# Patient Record
Sex: Male | Born: 1935 | Race: White | Hispanic: No | Smoking: Former smoker
Health system: Southern US, Community
[De-identification: ages and names within clinical notes are randomized; demographics above are authoritative.]

## PROBLEM LIST (undated history)

## (undated) DIAGNOSIS — I350 Nonrheumatic aortic (valve) stenosis: Secondary | ICD-10-CM

## (undated) DIAGNOSIS — R251 Tremor, unspecified: Secondary | ICD-10-CM

## (undated) DIAGNOSIS — N400 Enlarged prostate without lower urinary tract symptoms: Secondary | ICD-10-CM

## (undated) DIAGNOSIS — I10 Essential (primary) hypertension: Secondary | ICD-10-CM

## (undated) DIAGNOSIS — I509 Heart failure, unspecified: Secondary | ICD-10-CM

## (undated) DIAGNOSIS — H9190 Unspecified hearing loss, unspecified ear: Secondary | ICD-10-CM

## (undated) DIAGNOSIS — K219 Gastro-esophageal reflux disease without esophagitis: Secondary | ICD-10-CM

## (undated) DIAGNOSIS — I779 Disorder of arteries and arterioles, unspecified: Secondary | ICD-10-CM

## (undated) DIAGNOSIS — R011 Cardiac murmur, unspecified: Secondary | ICD-10-CM

## (undated) DIAGNOSIS — Z951 Presence of aortocoronary bypass graft: Secondary | ICD-10-CM

## (undated) DIAGNOSIS — E785 Hyperlipidemia, unspecified: Secondary | ICD-10-CM

## (undated) DIAGNOSIS — I739 Peripheral vascular disease, unspecified: Secondary | ICD-10-CM

## (undated) DIAGNOSIS — C801 Malignant (primary) neoplasm, unspecified: Secondary | ICD-10-CM

## (undated) DIAGNOSIS — I6523 Occlusion and stenosis of bilateral carotid arteries: Secondary | ICD-10-CM

## (undated) DIAGNOSIS — E0590 Thyrotoxicosis, unspecified without thyrotoxic crisis or storm: Secondary | ICD-10-CM

## (undated) DIAGNOSIS — Z8619 Personal history of other infectious and parasitic diseases: Secondary | ICD-10-CM

## (undated) DIAGNOSIS — I2510 Atherosclerotic heart disease of native coronary artery without angina pectoris: Secondary | ICD-10-CM

## (undated) HISTORY — DX: Cardiac murmur, unspecified: R01.1

## (undated) HISTORY — DX: Peripheral vascular disease, unspecified: I73.9

## (undated) HISTORY — DX: Atherosclerotic heart disease of native coronary artery without angina pectoris: I25.10

## (undated) HISTORY — PX: TONSILLECTOMY: SUR1361

## (undated) HISTORY — DX: Presence of aortocoronary bypass graft: Z95.1

## (undated) HISTORY — PX: COLONOSCOPY: SHX174

## (undated) HISTORY — DX: Nonrheumatic aortic (valve) stenosis: I35.0

## (undated) HISTORY — DX: Essential (primary) hypertension: I10

## (undated) HISTORY — DX: Occlusion and stenosis of bilateral carotid arteries: I65.23

## (undated) HISTORY — PX: EYE SURGERY: SHX253

## (undated) HISTORY — DX: Disorder of arteries and arterioles, unspecified: I77.9

## (undated) HISTORY — DX: Hyperlipidemia, unspecified: E78.5

---

## 1994-02-25 HISTORY — PX: CARDIAC CATHETERIZATION: SHX172

## 1994-02-27 DIAGNOSIS — Z951 Presence of aortocoronary bypass graft: Secondary | ICD-10-CM

## 1994-02-27 HISTORY — PX: CORONARY ARTERY BYPASS GRAFT: SHX141

## 1994-02-27 HISTORY — DX: Presence of aortocoronary bypass graft: Z95.1

## 1998-05-04 HISTORY — PX: INTRAOCULAR LENS INSERTION: SHX110

## 2001-05-31 ENCOUNTER — Encounter: Payer: Self-pay | Admitting: Endocrinology

## 2001-05-31 ENCOUNTER — Ambulatory Visit (HOSPITAL_COMMUNITY): Admission: RE | Admit: 2001-05-31 | Discharge: 2001-05-31 | Payer: Self-pay | Admitting: Endocrinology

## 2001-06-14 ENCOUNTER — Encounter: Admission: RE | Admit: 2001-06-14 | Discharge: 2001-06-14 | Payer: Self-pay | Admitting: Orthopedic Surgery

## 2001-06-14 ENCOUNTER — Encounter: Payer: Self-pay | Admitting: Orthopedic Surgery

## 2001-06-15 ENCOUNTER — Ambulatory Visit (HOSPITAL_BASED_OUTPATIENT_CLINIC_OR_DEPARTMENT_OTHER): Admission: RE | Admit: 2001-06-15 | Discharge: 2001-06-15 | Payer: Self-pay | Admitting: Orthopedic Surgery

## 2002-09-18 ENCOUNTER — Ambulatory Visit (HOSPITAL_COMMUNITY): Admission: RE | Admit: 2002-09-18 | Discharge: 2002-09-18 | Payer: Self-pay | Admitting: Urology

## 2002-09-18 ENCOUNTER — Encounter: Payer: Self-pay | Admitting: Urology

## 2002-09-21 ENCOUNTER — Encounter: Payer: Self-pay | Admitting: Urology

## 2002-09-21 ENCOUNTER — Ambulatory Visit (HOSPITAL_BASED_OUTPATIENT_CLINIC_OR_DEPARTMENT_OTHER): Admission: RE | Admit: 2002-09-21 | Discharge: 2002-09-21 | Payer: Self-pay | Admitting: Urology

## 2003-05-05 DIAGNOSIS — Z8619 Personal history of other infectious and parasitic diseases: Secondary | ICD-10-CM

## 2003-05-05 HISTORY — DX: Personal history of other infectious and parasitic diseases: Z86.19

## 2006-07-02 ENCOUNTER — Encounter: Admission: RE | Admit: 2006-07-02 | Discharge: 2006-07-02 | Payer: Self-pay | Admitting: Otolaryngology

## 2006-07-06 ENCOUNTER — Ambulatory Visit (HOSPITAL_BASED_OUTPATIENT_CLINIC_OR_DEPARTMENT_OTHER): Admission: RE | Admit: 2006-07-06 | Discharge: 2006-07-07 | Payer: Self-pay | Admitting: Otolaryngology

## 2009-04-08 ENCOUNTER — Emergency Department (HOSPITAL_COMMUNITY): Admission: EM | Admit: 2009-04-08 | Discharge: 2009-04-08 | Payer: Self-pay | Admitting: Emergency Medicine

## 2010-04-01 HISTORY — PX: NM MYOVIEW LTD: HXRAD82

## 2010-08-05 LAB — URINALYSIS, ROUTINE W REFLEX MICROSCOPIC
Bilirubin Urine: NEGATIVE
Glucose, UA: NEGATIVE mg/dL
Ketones, ur: NEGATIVE mg/dL
Nitrite: NEGATIVE
Protein, ur: NEGATIVE mg/dL
Specific Gravity, Urine: 1.017 (ref 1.005–1.030)
Urobilinogen, UA: 1 mg/dL (ref 0.0–1.0)
pH: 8 (ref 5.0–8.0)

## 2010-08-05 LAB — URINE MICROSCOPIC-ADD ON

## 2010-08-05 LAB — POCT I-STAT, CHEM 8
Chloride: 103 mEq/L (ref 96–112)
Creatinine, Ser: 1.3 mg/dL (ref 0.4–1.5)
Glucose, Bld: 124 mg/dL — ABNORMAL HIGH (ref 70–99)
Potassium: 3.7 mEq/L (ref 3.5–5.1)

## 2010-08-05 LAB — URINE CULTURE

## 2010-09-18 ENCOUNTER — Other Ambulatory Visit: Payer: Self-pay | Admitting: Dermatology

## 2010-09-19 NOTE — Op Note (Signed)
NAMEMAVERICK, Austin Ward         ACCOUNT NO.:  [id number]   MEDICAL RECORD NO.:  [id number]          PATIENT TYPE:  AMB   LOCATION:  DSC                          FACILITY:  [facility]   PHYSICIAN:  Christopher E. Ezzard Standing, M.D.DATE OF BIRTH:  [birth year]   DATE OF PROCEDURE:  07/06/2006  DATE OF DISCHARGE:                               OPERATIVE REPORT   PREOPERATIVE DIAGNOSIS:  Left lateral tongue leukoplakia, with biopsy  showing carcinoma in situ.   POSTOPERATIVE DIAGNOSIS:  Left lateral tongue leukoplakia, with biopsy  showing carcinoma in situ.   OPERATION:  CO2 laser excision of left lateral tongue leukoplakia, and  ablation of leukoplakia in left floor of mouth region.   SURGEON:  Kristine Garbe. Ezzard Standing, M.D.   ANESTHESIA:  General endotracheal.   COMPLICATIONS:  None.   BRIEF CLINICAL NOTE:  Austin Ward is an 77 year old gentleman,  who has had a little bit of soreness on the left side of his tongue for  a little over a month.  He had some irregular mucosal changes with a  little bit of leukoplakia.  A biopsy by Dr. Manson Passey demonstrated carcinoma  in situ.  He is taken to the operating room at this time for excision of  the left lateral tongue mucosal changes.  He also has a few mucosal  lesions on the left floor of mouth in the sulcus left side, as well as  some smaller changes over the anterior floor of mouth around the  submandibular duct openings.   DESCRIPTION OF PROCEDURE:  After adequate endotracheal anesthesia, the  mouth and tongue were examined.  There was irregularity of the mucosa on  the left lateral tongue.  The margins of this irregularity were marked  out with the CO2 laser, and then, using the CO2 laser at 10 W  continuous, the mucosa over this area was excised and sent to pathology  for permanent sections.  There are a few other small mucosal  abnormalities along more inferiorly on the left side, left floor of  mouth, and a little bit anteriorly over  the region of the submandibular  duct openings.  These areas were ablated with CO2 laser at a defocused  beam.  Hemostasis was obtained with a cold sponge compress, left lateral  tongue.  The patient was awakened from anesthesia and transferred to the  recovery room postoperatively doing well.   DISPOSITION:  Austin Ward will be observed overnight in the recovery care  center because of living alone at home.  We will plan discharge tomorrow  morning on amoxicillin antibiotic and Hycodan p.r.n. pain.  We will have  him follow up in my office in one week for recheck and review pathology.           ______________________________  Kristine Garbe Ezzard Standing, M.D.     CEN/MEDQ  D:  07/06/2006  T:  07/06/2006  Job:  [id number]   cc:   Grant Ruts., D.D.S.

## 2010-09-19 NOTE — Op Note (Signed)
[place]  Patient:    DAVIDE, RISDON Visit Number: [id number] MRN: [id number]          Service Type: DSU Location: [facility] Attending Physician:  Marlowe Shores Dictated by:   Artist Pais Mina Marble, M.D. Proc. Date: 06/15/01 Admit Date:  06/15/2001                             Operative Report  PREOPERATIVE DIAGNOSIS:  Left ring finger foreign body in volar aspect over the middle phalanx.  POSTOPERATIVE DIAGNOSIS:  Left ring finger foreign body in volar aspect over the middle phalanx.  PROCEDURE:  Excision of foreign body of the left ring finger.  SURGEON:  Artist Pais. Mina Marble, M.D.  ASSISTANT:  ______  ANESTHESIA:  Monitored anesthesia care with 2% plain lidocaine local block.  TOURNIQUET TIME:  10 minutes.  COMPLICATIONS:  None.  DRAINS:  None.  DESCRIPTION OF PROCEDURE:  The patient was taken to the operating room.  After the induction of adequate IV sedation, the left upper extremity was prepped and draped in the usual sterile fashion.  An Esmarch was used to exsanguinate the limb.  The tourniquet was inflated to 200 mmHg.  At this point in time, a small amount of 2% plain lidocaine was injected subcutaneously over the middle phalanx and a Brunner-type incision was made, incision taken down to the skin and subcutaneous tissues.  The ______ neurovascular bundle was carefully retracted and a piece of glass was removed from the deep tissues just overlying the flexor sheath.  The wound was then thoroughly irrigated and closed with 5-0 nylon and horizontal mattress sutures x2.  A sterile dressing with Xeroform and 4 x 4s and compressive dressing was applied.  The patient tolerated the procedure well and went to the recovery room in stable fashion. Dictated by:   Artist Pais Mina Marble, M.D. Attending Physician:  Marlowe Shores DD:  06/15/01 TD:  06/15/01 Job: [id number]

## 2011-07-15 ENCOUNTER — Other Ambulatory Visit: Payer: Self-pay | Admitting: Dermatology

## 2012-05-17 ENCOUNTER — Other Ambulatory Visit (HOSPITAL_COMMUNITY): Payer: Self-pay | Admitting: Cardiovascular Disease

## 2012-05-17 DIAGNOSIS — R011 Cardiac murmur, unspecified: Secondary | ICD-10-CM

## 2012-05-17 DIAGNOSIS — I6529 Occlusion and stenosis of unspecified carotid artery: Secondary | ICD-10-CM

## 2012-06-16 ENCOUNTER — Ambulatory Visit (HOSPITAL_COMMUNITY): Payer: Self-pay

## 2012-06-16 ENCOUNTER — Inpatient Hospital Stay (HOSPITAL_COMMUNITY): Admission: RE | Admit: 2012-06-16 | Payer: Self-pay | Source: Ambulatory Visit

## 2012-06-16 ENCOUNTER — Encounter (HOSPITAL_COMMUNITY): Payer: Self-pay

## 2012-07-04 ENCOUNTER — Ambulatory Visit (HOSPITAL_COMMUNITY)
Admission: RE | Admit: 2012-07-04 | Discharge: 2012-07-04 | Disposition: A | Discharge status: Home / Self Care | Payer: Medicare Other | Source: Ambulatory Visit | Attending: Cardiovascular Disease | Admitting: Cardiovascular Disease

## 2012-07-04 DIAGNOSIS — I6529 Occlusion and stenosis of unspecified carotid artery: Secondary | ICD-10-CM | POA: Insufficient documentation

## 2012-07-04 DIAGNOSIS — R011 Cardiac murmur, unspecified: Secondary | ICD-10-CM | POA: Insufficient documentation

## 2012-07-04 HISTORY — PX: US ECHOCARDIOGRAPHY: HXRAD669

## 2012-07-04 NOTE — Progress Notes (Signed)
Carotid Duplex Completed. Sturdivant, Rita D  

## 2012-07-04 NOTE — Progress Notes (Signed)
2D Echo Performed 07/04/2012    Halona Amstutz, RCS  

## 2012-08-29 ENCOUNTER — Encounter: Payer: Self-pay | Admitting: *Deleted

## 2012-09-01 ENCOUNTER — Encounter: Payer: Self-pay | Admitting: Cardiovascular Disease

## 2012-09-07 ENCOUNTER — Ambulatory Visit (INDEPENDENT_AMBULATORY_CARE_PROVIDER_SITE_OTHER): Payer: Medicare Other | Admitting: Podiatry

## 2012-09-07 ENCOUNTER — Encounter: Payer: Self-pay | Admitting: Podiatry

## 2012-09-07 VITALS — BP 135/57 | HR 64 | Ht 70.0 in | Wt 175.0 lb

## 2012-09-07 DIAGNOSIS — B351 Tinea unguium: Secondary | ICD-10-CM | POA: Insufficient documentation

## 2012-09-07 DIAGNOSIS — M25579 Pain in unspecified ankle and joints of unspecified foot: Secondary | ICD-10-CM

## 2012-09-07 NOTE — Progress Notes (Signed)
Subjective: 79 y.o. year old male patient presents complaining of painful nails. Patient requests toe nails trimmed. Left foot hurts on the ball. Patient had problem with his hearing aid and could not answer to questions on Review Of Systems. He will be getting a new hearing aid this week.  Review of Systems - Unable to obtain due to hearing problem.  Objective: Dermatologic: Thick yellow deformed nails x 10.   Contracted digits bilateral. No plantar skin lesions. Vascular: Pedal pulses are not palpable on both feet.   Orthopedic: Contracted lesser digits in high arched cavus foot bilateral. Thin plantar fat pad bilateral. Neurologic: All epicritic and tactile sensations grossly intact. Positive response to Monofilament sensory and vibratory testing.  Assessment: Dystrophic mycotic nails x 10. Lesser metatarsalgia due to thin plantar fat pad.  Treatment: All mycotic nails debrided.  Advised to use well padded Tennis shoes. Return in 3 months or as needed.

## 2012-09-19 ENCOUNTER — Other Ambulatory Visit: Payer: Self-pay | Admitting: Pharmacist

## 2012-09-19 MED ORDER — DILTIAZEM HCL ER BEADS 180 MG PO CP24: 180.0000 mg | ORAL_CAPSULE | Freq: Every day | ORAL | Status: DC

## 2012-09-22 ENCOUNTER — Other Ambulatory Visit: Payer: Self-pay | Admitting: *Deleted

## 2012-09-22 NOTE — Telephone Encounter (Signed)
Please refill this 

## 2012-10-10 ENCOUNTER — Telehealth: Payer: Self-pay | Admitting: Cardiovascular Disease

## 2012-10-10 MED ORDER — ROSUVASTATIN CALCIUM 5 MG PO TABS: 5.0000 mg | ORAL_TABLET | ORAL | Status: DC

## 2012-10-10 NOTE — Telephone Encounter (Signed)
Pt wants to come in and get samples of Crestor. He is out of his medication. He would like for someone to call him back to let him know when he can come pick it up.

## 2012-10-10 NOTE — Telephone Encounter (Signed)
Samples left at front desk for pt pick up.  Lot: [id number] Exp: 04/2015.  Call to pt and informed.  Pt verbalized understanding and agreed w/ plan.

## 2012-10-10 NOTE — Addendum Note (Signed)
Addended by: Beecher Mcardle R on: 10/10/2012 06:06 PM   Modules accepted: Orders

## 2012-10-11 ENCOUNTER — Telehealth: Payer: Self-pay | Admitting: Cardiovascular Disease

## 2012-10-11 NOTE — Telephone Encounter (Signed)
Pt was in earlier to get a Crestor Rx. Pt states that he takes 10 mg not 5mg  and he was given 5 mg. He asked that someone please call him back.

## 2012-10-12 ENCOUNTER — Other Ambulatory Visit: Payer: Self-pay | Admitting: *Deleted

## 2012-10-12 MED ORDER — DILTIAZEM HCL ER COATED BEADS 180 MG PO CP24: 180.0000 mg | ORAL_CAPSULE | Freq: Every day | ORAL | Status: DC

## 2012-10-12 NOTE — Telephone Encounter (Signed)
Returned call.  Left message to call back.  Per chart, pt takes Crestor 5mg  weekly.

## 2012-10-12 NOTE — Telephone Encounter (Signed)
Returned call.  Left message to call back before 4pm if assistance is still needed. 

## 2012-10-17 ENCOUNTER — Telehealth: Payer: Self-pay | Admitting: Cardiovascular Disease

## 2012-10-17 NOTE — Telephone Encounter (Signed)
Returning call from you-Could not understand her message! Need to know aboutt his Crestor!

## 2012-10-17 NOTE — Telephone Encounter (Signed)
Returned call.  Pt stated his Crestor was increased to 10mg /week a few months ago and the samples he received were 5mg .  Pt informed his med list has 5mg /week and that's what was left for him.  Reviewed labs and found pt was increased to 5mg  twice/week.  Pt informed and stated he takes 10mg /week.  Pt informed 10mg  samples available and will be left for him at the front desk.  Pt verbalized understanding and agreed w/ plan.  Pt will use the 5mg  samples he was already given and understands to take two 5mg  tabs/week.

## 2012-10-18 ENCOUNTER — Other Ambulatory Visit: Payer: Self-pay | Admitting: Dermatology

## 2012-12-12 ENCOUNTER — Ambulatory Visit (INDEPENDENT_AMBULATORY_CARE_PROVIDER_SITE_OTHER): Payer: Medicare Other | Admitting: Podiatry

## 2012-12-12 ENCOUNTER — Encounter: Payer: Self-pay | Admitting: Podiatry

## 2012-12-12 DIAGNOSIS — M25579 Pain in unspecified ankle and joints of unspecified foot: Secondary | ICD-10-CM

## 2012-12-12 DIAGNOSIS — B351 Tinea unguium: Secondary | ICD-10-CM

## 2012-12-12 NOTE — Progress Notes (Signed)
Subjective: 79 y.o. year old male patient presents complaining of painful nails. Patient requests toe nails, corns and calluses trimmed.   Reviewed medications, allergies, past surgical and medical histories.  Objective: Dermatologic: Thick yellow deformed nails x 10.  Vascular: Pedal pulses are not palpable on both DP and PT bilateral. Orthopedic: Contracted lesser digits  Neurologic: All epicritic and tactile sensations grossly intact.  Assessment: Dystrophic mycotic nails x 10. Painful feet. PVD.  Treatment: All mycotic nails debrided.  Return in 3 months or as needed.

## 2013-01-09 ENCOUNTER — Other Ambulatory Visit: Payer: Self-pay | Admitting: Cardiovascular Disease

## 2013-01-10 NOTE — Telephone Encounter (Signed)
Rx was sent to pharmacy electronically. 

## 2013-01-30 ENCOUNTER — Telehealth: Payer: Self-pay | Admitting: Cardiovascular Disease

## 2013-01-30 NOTE — Telephone Encounter (Signed)
Would  Like to have some samples of Crestor 10 mg please.

## 2013-01-30 NOTE — Telephone Encounter (Signed)
Returned call.  Informed pt no samples available.  Pt verbalized understanding and agreed w/ plan.  Pt will call back on Friday.

## 2013-02-23 ENCOUNTER — Telehealth: Payer: Self-pay | Admitting: Cardiovascular Disease

## 2013-02-23 NOTE — Telephone Encounter (Signed)
Would like to have samples of Crestor 10 mg please.Please let him know if you have them.

## 2013-02-24 MED ORDER — ROSUVASTATIN CALCIUM 10 MG PO TABS: 10.0000 mg | ORAL_TABLET | ORAL | Status: DC

## 2013-02-24 NOTE — Telephone Encounter (Signed)
Returned call.  Left message that samples at front desk and to call back before 4pm if questions.  

## 2013-03-14 ENCOUNTER — Ambulatory Visit (INDEPENDENT_AMBULATORY_CARE_PROVIDER_SITE_OTHER): Payer: Medicare Other | Admitting: Podiatry

## 2013-03-14 ENCOUNTER — Encounter: Payer: Self-pay | Admitting: Podiatry

## 2013-03-14 VITALS — BP 151/61 | HR 65

## 2013-03-14 DIAGNOSIS — B351 Tinea unguium: Secondary | ICD-10-CM

## 2013-03-14 DIAGNOSIS — M25579 Pain in unspecified ankle and joints of unspecified foot: Secondary | ICD-10-CM

## 2013-03-14 NOTE — Patient Instructions (Signed)
Seen for hypertrophic nails x 10. Debrided all nails. Return in 3 months.  

## 2013-03-14 NOTE — Progress Notes (Signed)
Subjective:  79 y.o. year old male patient presents complaining of painful nails. Patient requests toe nails, corns and calluses trimmed.   Objective: Dermatologic: Thick yellow deformed nails x 10.  Vascular: Pedal pulses are not palpable on both DP and PT bilateral.  Orthopedic: Contracted lesser digits x 10.  Neurologic: All epicritic and tactile sensations grossly intact.   Assessment:  Dystrophic mycotic nails x 10.  Painful feet.  PVD.   Treatment: All mycotic nails debrided.  Return in 3 months or as needed

## 2013-04-10 ENCOUNTER — Encounter: Payer: Self-pay | Admitting: Cardiovascular Disease

## 2013-04-10 ENCOUNTER — Ambulatory Visit (INDEPENDENT_AMBULATORY_CARE_PROVIDER_SITE_OTHER): Payer: Medicare Other | Admitting: Cardiovascular Disease

## 2013-04-10 VITALS — BP 120/60 | HR 65 | Resp 16 | Ht 70.0 in | Wt 176.7 lb

## 2013-04-10 DIAGNOSIS — E782 Mixed hyperlipidemia: Secondary | ICD-10-CM

## 2013-04-10 DIAGNOSIS — Z79899 Other long term (current) drug therapy: Secondary | ICD-10-CM

## 2013-04-10 DIAGNOSIS — I359 Nonrheumatic aortic valve disorder, unspecified: Secondary | ICD-10-CM

## 2013-04-10 DIAGNOSIS — I658 Occlusion and stenosis of other precerebral arteries: Secondary | ICD-10-CM

## 2013-04-10 DIAGNOSIS — E785 Hyperlipidemia, unspecified: Secondary | ICD-10-CM

## 2013-04-10 DIAGNOSIS — I2510 Atherosclerotic heart disease of native coronary artery without angina pectoris: Secondary | ICD-10-CM

## 2013-04-10 DIAGNOSIS — I350 Nonrheumatic aortic (valve) stenosis: Secondary | ICD-10-CM

## 2013-04-10 DIAGNOSIS — I6529 Occlusion and stenosis of unspecified carotid artery: Secondary | ICD-10-CM

## 2013-04-10 DIAGNOSIS — I10 Essential (primary) hypertension: Secondary | ICD-10-CM

## 2013-04-10 DIAGNOSIS — I6523 Occlusion and stenosis of bilateral carotid arteries: Secondary | ICD-10-CM

## 2013-04-10 NOTE — Patient Instructions (Signed)
Have FASTING lab work done at [facility].  Your physician has requested that you have a carotid duplex in March 2015.  This test is an ultrasound of the carotid arteries in your neck. It looks at blood flow through these arteries that supply the brain with blood. Allow one hour for this exam. There are no restrictions or special instructions.  Cr. C  recommends that you schedule a follow-up appointment in: ONE YEAR.

## 2013-04-16 ENCOUNTER — Encounter: Payer: Self-pay | Admitting: Cardiovascular Disease

## 2013-04-16 DIAGNOSIS — E785 Hyperlipidemia, unspecified: Secondary | ICD-10-CM | POA: Insufficient documentation

## 2013-04-16 DIAGNOSIS — I6523 Occlusion and stenosis of bilateral carotid arteries: Secondary | ICD-10-CM

## 2013-04-16 DIAGNOSIS — I10 Essential (primary) hypertension: Secondary | ICD-10-CM | POA: Insufficient documentation

## 2013-04-16 DIAGNOSIS — I2510 Atherosclerotic heart disease of native coronary artery without angina pectoris: Secondary | ICD-10-CM | POA: Insufficient documentation

## 2013-04-16 DIAGNOSIS — I350 Nonrheumatic aortic (valve) stenosis: Secondary | ICD-10-CM

## 2013-04-16 HISTORY — DX: Essential (primary) hypertension: I10

## 2013-04-16 HISTORY — DX: Nonrheumatic aortic (valve) stenosis: I35.0

## 2013-04-16 HISTORY — DX: Occlusion and stenosis of bilateral carotid arteries: I65.23

## 2013-04-16 NOTE — Assessment & Plan Note (Signed)
Bypass surgery provided a remarkably durable benefit. He is asymptomatic. He has normal left ventricular systolic function.

## 2013-04-16 NOTE — Assessment & Plan Note (Signed)
Asymptomatic. Reevaluate in one year.

## 2013-04-16 NOTE — Assessment & Plan Note (Signed)
Although his LDL cholesterol is not at target, to the best we have been able to achieve because of his myalgia. He is due a repeat lipid profile in a few weeks.

## 2013-04-16 NOTE — Assessment & Plan Note (Signed)
Asymptomatic mild to moderate aortic stenosis, repeat echo if he develops symptoms of chest pain with exertion, dyspnea on exertion or dizziness/syncope with exertion.

## 2013-04-16 NOTE — Assessment & Plan Note (Signed)
Good control. Note that he is taking both a beta blocker and a centrally acting calcium channel blocker and has right bundle branch block. Low threshold to discontinue the diltiazem should he develop symptoms of dizziness or syncope.

## 2013-04-16 NOTE — Progress Notes (Signed)
Patient ID: Austin Ward, male   DOB: 1924/04/28, 79 y.o.   MRN: [id number]      Reason for office visit CAD, aortic stenosis, carotid stenosis, hypertension, hyperlipidemia  Austin Ward is doing well. It has been almost 20 years since he underwent bypass surgery. He has no cardiovascular complaints. He specifically denies angina and dyspnea. Sometimes toward the end of a long day he may have some mild ankle swelling. He has not required repeat catheterization or any revascularization procedure since his initial bypass surgery. He had a normal nuclear stress test in 2011. He has normal left ventricular systolic function. An echo performed in March of this year showed mild to moderate aortic valve stenosis with a valve area of 1.5 cm square and a mean gradient of 18 mm Hg. He has bilateral carotid disease, more severe on the right where it is 50-70% in severity, stable however by serial studies. He takes once weekly Crestor which does a suboptimal job of controlling his LDL cholesterol but he has been intolerant to higher doses or any other statins.   Allergies  Allergen Reactions  . Crestor [Rosuvastatin] Other (See Comments)    Leg weakness  . Lipitor [Atorvastatin] Other (See Comments)    Leg weakness    Current Outpatient Prescriptions  Medication Sig Dispense Refill  . acetaminophen (TYLENOL) 325 MG tablet Take 325 mg by mouth 2 (two) times daily.      Marland Kitchen aspirin 81 MG tablet Take 81 mg by mouth daily.      . Cholecalciferol (VITAMIN D3) 2000 UNITS TABS Take 2,000 Int'l Units by mouth daily.      Marland Kitchen diltiazem (CARTIA XT) 180 MG 24 hr capsule Take 1 capsule (180 mg total) by mouth daily.  30 capsule  6  . finasteride (PROSCAR) 5 MG tablet Take 5 mg by mouth daily.      . metoprolol succinate (TOPROL-XL) 25 MG 24 hr tablet TAKE 1 TABLET BY MOUTH ONCE DAILY  30 tablet  3  . omeprazole (PRILOSEC) 20 MG capsule Take 20 mg by mouth daily.      . potassium chloride (K-DUR) 10 MEQ  tablet TAKE 1 TABLET BY MOUTH EVERY DAY  30 tablet  3  . propylthiouracil (PTU) 50 MG tablet Take 50 mg by mouth 2 (two) times daily.       . rosuvastatin (CRESTOR) 10 MG tablet Take 1 tablet (10 mg total) by mouth every 7 (seven) days.  14 tablet  0  . sod citrate-citric acid (ORACIT) 490-640 MG/5ML SOLN Take 15 mLs by mouth 2 (two) times daily after a meal. Take 3 tablespoons in the morning and 2 tablespoons at night      . tamsulosin (FLOMAX) 0.4 MG CAPS Take 0.4 mg by mouth daily.      Marland Kitchen triamterene-hydrochlorothiazide (MAXZIDE-25) 37.5-25 MG per tablet TAKE 1 TABLET BY MOUTH ONCE DAILY  30 tablet  3   No current facility-administered medications for this visit.    Past Medical History  Diagnosis Date  . CAD (coronary artery disease)   . S/P CABG x 4 02/27/1994    LIMA to LAD,SVG to OM,SVG to 2nd OM,SVG to RCA-Dr. Laneta Simmers  . Heart murmur   . Carotid arterial disease     07/04/12 doppler:right ICA 50-69%,left bulb & ICA 0-49%  . Systemic hypertension   . Dyslipidemia   . Bilateral carotid artery stenosis 04/16/2013  . Aortic valve stenosis 04/16/2013    1.5 cm by echo March 2014  .  Essential hypertension 04/16/2013    Did not tolerate higher doses of statins. Satisfactory control on once weekly Crestor     Past Surgical History  Procedure Laterality Date  . Coronary artery bypass graft  02/27/1994    LIMA to LAD,SVG to OM,SVG to 2nd OM,SVG to RCA  . Intraocular lens insertion  2000  . Cardiac catheterization  02/25/1994  . US echocardiography  07/04/2012    mild to mod AOV stenosis,mitral annular ca+,LA mildly to mod. dilated  . Nm myoview ltd  04/01/2010    no ischemia, EF 60%    No family history on file.  History   Social History  . Marital Status: Single    Spouse Name: N/A    Number of Children: N/A  . Years of Education: N/A   Occupational History  . Not on file.   Social History Main Topics  . Smoking status: Former Smoker    Quit date: 05/03/1996  .  Smokeless tobacco: Never Used  . Alcohol Use: No  . Drug Use: Not on file  . Sexual Activity: Not on file   Other Topics Concern  . Not on file   Social History Narrative  . No narrative on file    Review of systems: The patient specifically denies any chest pain at rest or with exertion, dyspnea at rest or with exertion, orthopnea, paroxysmal nocturnal dyspnea, syncope, palpitations, focal neurological deficits, intermittent claudication, lower extremity edema, unexplained weight gain, cough, hemoptysis or wheezing.  The patient also denies abdominal pain, nausea, vomiting, dysphagia, diarrhea, constipation, polyuria, polydipsia, dysuria, hematuria, frequency, urgency, abnormal bleeding or bruising, fever, chills, unexpected weight changes, mood swings, change in skin or hair texture, change in voice quality, auditory or visual problems, allergic reactions or rashes, new musculoskeletal complaints other than usual "aches and pains".   PHYSICAL EXAM BP 120/60  Pulse 65  Resp 16  Ht 5\' 10"  (1.778 m)  Wt 176 lb 11.2 oz (80.151 kg)  BMI 25.35 kg/m2  General: Alert, oriented x3, no distress Head: no evidence of trauma, PERRL, EOMI, no exophtalmos or lid lag, no myxedema, no xanthelasma; normal ears, nose and oropharynx Neck: normal jugular venous pulsations and no hepatojugular reflux; brisk carotid pulses without delay and loud bilateral carotid bruits Chest: clear to auscultation, no signs of consolidation by percussion or palpation, normal fremitus, symmetrical and full respiratory excursions, sternotomy scar Cardiovascular: normal position and quality of the apical impulse, regular rhythm, normal first and widely split second heart sounds, no rubs or gallops, early peaking 2/6 systolic ejection murmur in the aortic focus radiating towards the neck Abdomen: no tenderness or distention, no masses by palpation, no abnormal pulsatility or arterial bruits, normal bowel sounds, no  hepatosplenomegaly Extremities: no clubbing, cyanosis or edema; 2+ radial, ulnar and brachial pulses bilaterally; 2+ right femoral, posterior tibial and dorsalis pedis pulses; 2+ left femoral, posterior tibial and dorsalis pedis pulses; no subclavian or femoral bruits Neurological: grossly nonfocal   EKG: Sinus rhythm right bundle branch block  Lipid Panel  January 2014 total cholesterol 177, triglycerides 82, HDL 37, LDL 124. He did not tolerate increasing Crestor to twice a week after these tests  BMET    Component Value Date/Time   NA 143 04/08/2009 1533   K 3.7 04/08/2009 1533   CL 103 04/08/2009 1533   GLUCOSE 124* 04/08/2009 1533   BUN 21 04/08/2009 1533   CREATININE 1.3 04/08/2009 1533     ASSESSMENT AND PLAN Aortic valve stenosis Asymptomatic mild to  moderate aortic stenosis, repeat echo if he develops symptoms of chest pain with exertion, dyspnea on exertion or dizziness/syncope with exertion.  Bilateral carotid artery stenosis Asymptomatic. Reevaluate in one year.  CAD s/p CABG 1995 Bypass surgery provided a remarkably durable benefit. He is asymptomatic. He has normal left ventricular systolic function.  Essential hypertension Good control. Note that he is taking both a beta blocker and a centrally acting calcium channel blocker and has right bundle branch block. Low threshold to discontinue the diltiazem should he develop symptoms of dizziness or syncope.  Dyslipidemia Although his LDL cholesterol is not at target, to the best we have been able to achieve because of his myalgia. He is due a repeat lipid profile in a few weeks.   Patient Instructions  Have FASTING lab work done at your convenience.  Your physician has requested that you have a carotid duplex in March 2015.  This test is an ultrasound of the carotid arteries in your neck. It looks at blood flow through these arteries that supply the brain with blood. Allow one hour for this exam. There are no  restrictions or special instructions.  Cr. C  recommends that you schedule a follow-up appointment in: ONE YEAR.     Orders Placed This Encounter  Procedures  . Comprehensive metabolic panel  . Lipid Profile  . EKG 12-Lead  . Carotid duplex   Meds ordered this encounter  Medications  . sod citrate-citric acid (ORACIT) 490-640 MG/5ML SOLN    Sig: Take 15 mLs by mouth 2 (two) times daily after a meal. Take 3 tablespoons in the morning and 2 tablespoons at night    Jonell Brumbaugh  Thurmon Fair, MD, [facility] [phone] office [phone] pager

## 2013-04-19 LAB — LIPID PANEL
Cholesterol: 131 mg/dL (ref 0–200)
HDL: 32 mg/dL — ABNORMAL LOW (ref >39)
Total CHOL/HDL Ratio: 4.1 Ratio

## 2013-04-19 LAB — COMPREHENSIVE METABOLIC PANEL
BUN: 25 mg/dL — ABNORMAL HIGH (ref 6–23)
CO2: 28 mEq/L (ref 19–32)
Calcium: 9 mg/dL (ref 8.4–10.5)
Chloride: 106 mEq/L (ref 96–112)
Creat: 1.14 mg/dL (ref 0.50–1.35)

## 2013-04-20 ENCOUNTER — Encounter: Payer: Self-pay | Admitting: Cardiovascular Disease

## 2013-05-08 ENCOUNTER — Other Ambulatory Visit: Payer: Self-pay | Admitting: Cardiovascular Disease

## 2013-05-08 ENCOUNTER — Other Ambulatory Visit: Payer: Self-pay | Admitting: *Deleted

## 2013-05-08 MED ORDER — DILTIAZEM HCL ER COATED BEADS 180 MG PO CP24: 180.0000 mg | ORAL_CAPSULE | Freq: Every day | ORAL | Status: DC

## 2013-05-22 ENCOUNTER — Telehealth: Payer: Self-pay | Admitting: Cardiovascular Disease

## 2013-05-22 MED ORDER — ROSUVASTATIN CALCIUM 10 MG PO TABS: 10.0000 mg | ORAL_TABLET | ORAL | Status: DC

## 2013-05-22 NOTE — Telephone Encounter (Signed)
Needs to get samples of Crestor 10 mg 1 a week.  Please call  (leave msg on machine if no answer)

## 2013-05-22 NOTE — Telephone Encounter (Signed)
Returned call and left message samples left at front desk.

## 2013-06-06 ENCOUNTER — Telehealth: Payer: Self-pay | Admitting: Cardiovascular Disease

## 2013-06-06 NOTE — Telephone Encounter (Signed)
Returned call.  Left message to call back tomorrow before 4pm and leave details about nature of call: what medication and what the concerns are.

## 2013-06-06 NOTE — Telephone Encounter (Signed)
Please call-concerning a journal he received in the mail.Some questions about some medicine they were talking about in the journal.

## 2013-06-09 ENCOUNTER — Telehealth: Payer: Self-pay | Admitting: *Deleted

## 2013-06-09 NOTE — Telephone Encounter (Signed)
Patient states all his meds have gone down to a Tier I level except Tamsulosin.  Told him he will need to contact the prescribing doctor for that one.  Voiced understanding.

## 2013-06-12 ENCOUNTER — Telehealth: Payer: Self-pay | Admitting: *Deleted

## 2013-06-12 NOTE — Telephone Encounter (Signed)
Received approval from [place] -  Metoprolol ER is approved for Tier I.

## 2013-06-14 ENCOUNTER — Ambulatory Visit (INDEPENDENT_AMBULATORY_CARE_PROVIDER_SITE_OTHER): Payer: Medicare Other | Admitting: Podiatry

## 2013-06-14 ENCOUNTER — Encounter: Payer: Self-pay | Admitting: Podiatry

## 2013-06-14 VITALS — BP 143/54 | HR 59 | Ht 70.0 in | Wt 175.0 lb

## 2013-06-14 DIAGNOSIS — B351 Tinea unguium: Secondary | ICD-10-CM

## 2013-06-14 DIAGNOSIS — M25579 Pain in unspecified ankle and joints of unspecified foot: Secondary | ICD-10-CM

## 2013-06-14 NOTE — Patient Instructions (Signed)
Seen for hypertrophic nails. All nails debrided. Return in 3 months or as needed.  

## 2013-06-14 NOTE — Progress Notes (Signed)
Subjective:  80 y.o. year old male patient presents complaining of painful nails. Patient requests toe nails trimmed.   Objective: Dermatologic: Thick yellow deformed nails x 10.  Vascular: Pedal pulses are not palpable on both DP and PT bilateral.  Orthopedic: High arched cavus foot with contracted lesser digits x 10.  Neurologic: All epicritic and tactile sensations grossly intact.   Assessment:  Dystrophic mycotic nails x 10.  Painful feet.   PVD.  Treatment: All mycotic nails debrided.  Return in 3 months or as needed

## 2013-06-26 ENCOUNTER — Telehealth: Payer: Self-pay | Admitting: *Deleted

## 2013-06-26 NOTE — Telephone Encounter (Signed)
Approval of tier exception approved for Potassium Chloride ER to Tier I level.  LM to advise patient.

## 2013-07-12 ENCOUNTER — Ambulatory Visit (HOSPITAL_COMMUNITY)
Admission: RE | Admit: 2013-07-12 | Discharge: 2013-07-12 | Disposition: A | Discharge status: Home / Self Care | Payer: Medicare Other | Source: Ambulatory Visit | Attending: Cardiovascular Disease | Admitting: Cardiovascular Disease

## 2013-07-12 DIAGNOSIS — I6523 Occlusion and stenosis of bilateral carotid arteries: Secondary | ICD-10-CM

## 2013-07-12 DIAGNOSIS — I6529 Occlusion and stenosis of unspecified carotid artery: Secondary | ICD-10-CM | POA: Insufficient documentation

## 2013-07-12 NOTE — Progress Notes (Signed)
Carotid Duplex Completed. °Brianna L Mazza,RVT °

## 2013-07-21 ENCOUNTER — Encounter (HOSPITAL_COMMUNITY): Payer: Self-pay | Admitting: Emergency Medicine

## 2013-07-21 ENCOUNTER — Observation Stay (HOSPITAL_COMMUNITY)
Admission: EM | Admit: 2013-07-21 | Discharge: 2013-07-22 | Disposition: A | Discharge status: Home / Self Care | Payer: Medicare Other | Attending: Internal Medicine | Admitting: Internal Medicine

## 2013-07-21 ENCOUNTER — Emergency Department (HOSPITAL_COMMUNITY): Payer: Medicare Other

## 2013-07-21 DIAGNOSIS — Z87891 Personal history of nicotine dependence: Secondary | ICD-10-CM | POA: Insufficient documentation

## 2013-07-21 DIAGNOSIS — Z951 Presence of aortocoronary bypass graft: Secondary | ICD-10-CM | POA: Insufficient documentation

## 2013-07-21 DIAGNOSIS — Z9889 Other specified postprocedural states: Secondary | ICD-10-CM | POA: Insufficient documentation

## 2013-07-21 DIAGNOSIS — Z79899 Other long term (current) drug therapy: Secondary | ICD-10-CM | POA: Insufficient documentation

## 2013-07-21 DIAGNOSIS — R079 Chest pain, unspecified: Secondary | ICD-10-CM | POA: Diagnosis present

## 2013-07-21 DIAGNOSIS — B351 Tinea unguium: Secondary | ICD-10-CM | POA: Insufficient documentation

## 2013-07-21 DIAGNOSIS — R011 Cardiac murmur, unspecified: Secondary | ICD-10-CM | POA: Insufficient documentation

## 2013-07-21 DIAGNOSIS — E785 Hyperlipidemia, unspecified: Secondary | ICD-10-CM | POA: Insufficient documentation

## 2013-07-21 DIAGNOSIS — R1013 Epigastric pain: Secondary | ICD-10-CM | POA: Insufficient documentation

## 2013-07-21 DIAGNOSIS — I6523 Occlusion and stenosis of bilateral carotid arteries: Secondary | ICD-10-CM | POA: Diagnosis present

## 2013-07-21 DIAGNOSIS — I350 Nonrheumatic aortic (valve) stenosis: Secondary | ICD-10-CM | POA: Diagnosis present

## 2013-07-21 DIAGNOSIS — I10 Essential (primary) hypertension: Secondary | ICD-10-CM | POA: Insufficient documentation

## 2013-07-21 DIAGNOSIS — I2510 Atherosclerotic heart disease of native coronary artery without angina pectoris: Secondary | ICD-10-CM | POA: Diagnosis present

## 2013-07-21 DIAGNOSIS — M25579 Pain in unspecified ankle and joints of unspecified foot: Secondary | ICD-10-CM

## 2013-07-21 DIAGNOSIS — R0789 Other chest pain: Principal | ICD-10-CM | POA: Insufficient documentation

## 2013-07-21 DIAGNOSIS — R0602 Shortness of breath: Secondary | ICD-10-CM | POA: Insufficient documentation

## 2013-07-21 DIAGNOSIS — R110 Nausea: Secondary | ICD-10-CM | POA: Insufficient documentation

## 2013-07-21 DIAGNOSIS — I359 Nonrheumatic aortic valve disorder, unspecified: Secondary | ICD-10-CM | POA: Insufficient documentation

## 2013-07-21 DIAGNOSIS — Z888 Allergy status to other drugs, medicaments and biological substances status: Secondary | ICD-10-CM | POA: Insufficient documentation

## 2013-07-21 LAB — I-STAT TROPONIN, ED: Troponin i, poc: 0 ng/mL (ref 0.00–0.08)

## 2013-07-21 LAB — COMPREHENSIVE METABOLIC PANEL
ALT: 5 U/L (ref 0–53)
AST: 12 U/L (ref 0–37)
Albumin: 3.6 g/dL (ref 3.5–5.2)
Alkaline Phosphatase: 54 U/L (ref 39–117)
BUN: 29 mg/dL — ABNORMAL HIGH (ref 6–23)
CO2: 26 mEq/L (ref 19–32)
Calcium: 9.5 mg/dL (ref 8.4–10.5)
Chloride: 101 mEq/L (ref 96–112)
Creatinine, Ser: 1.19 mg/dL (ref 0.50–1.35)
GFR calc non Af Amer: 53 mL/min — ABNORMAL LOW (ref >90)
GFR, EST AFRICAN AMERICAN: 61 mL/min — AB (ref >90)
GLUCOSE: 114 mg/dL — AB (ref 70–99)
Potassium: 3.9 mEq/L (ref 3.7–5.3)
SODIUM: 141 meq/L (ref 137–147)
Total Bilirubin: 0.7 mg/dL (ref 0.3–1.2)
Total Protein: 6.6 g/dL (ref 6.0–8.3)

## 2013-07-21 LAB — CBC
HCT: 42.4 % (ref 39.0–52.0)
HEMOGLOBIN: 14.9 g/dL (ref 13.0–17.0)
MCH: 31 pg (ref 26.0–34.0)
MCHC: 35.1 g/dL (ref 30.0–36.0)
MCV: 88.1 fL (ref 78.0–100.0)
Platelets: 213 10*3/uL (ref 150–400)
RBC: 4.81 MIL/uL (ref 4.22–5.81)
RDW: 13.9 % (ref 11.5–15.5)
WBC: 8.7 10*3/uL (ref 4.0–10.5)

## 2013-07-21 LAB — TROPONIN I

## 2013-07-21 MED ORDER — SODIUM CHLORIDE 0.9 % IV SOLN: INTRAVENOUS | Status: DC

## 2013-07-21 MED ORDER — FINASTERIDE 5 MG PO TABS
5.0000 mg | ORAL_TABLET | Freq: Every day | ORAL | Status: DC
  Administered 2013-07-22: 5 mg via ORAL
  Filled 2013-07-21: qty 1

## 2013-07-21 MED ORDER — ASPIRIN 325 MG PO TABS
325.0000 mg | ORAL_TABLET | Freq: Once | ORAL | Status: AC
  Administered 2013-07-21: 325 mg via ORAL
  Filled 2013-07-21: qty 1

## 2013-07-21 MED ORDER — MORPHINE SULFATE 2 MG/ML IJ SOLN: 1.0000 mg | INTRAMUSCULAR | Status: DC | PRN

## 2013-07-21 MED ORDER — POTASSIUM CHLORIDE ER 10 MEQ PO TBCR
10.0000 meq | EXTENDED_RELEASE_TABLET | Freq: Every day | ORAL | Status: DC
  Administered 2013-07-22: 10 meq via ORAL
  Filled 2013-07-21: qty 1

## 2013-07-21 MED ORDER — ENOXAPARIN SODIUM 40 MG/0.4ML ~~LOC~~ SOLN
40.0000 mg | Freq: Every day | SUBCUTANEOUS | Status: DC
  Administered 2013-07-21: 40 mg via SUBCUTANEOUS
  Filled 2013-07-21 (×2): qty 0.4

## 2013-07-21 MED ORDER — PROPYLTHIOURACIL 50 MG PO TABS
50.0000 mg | ORAL_TABLET | Freq: Two times a day (BID) | ORAL | Status: DC
  Administered 2013-07-21 – 2013-07-22 (×2): 50 mg via ORAL
  Filled 2013-07-21 (×4): qty 1

## 2013-07-21 MED ORDER — ATORVASTATIN CALCIUM 20 MG PO TABS: 20.0000 mg | ORAL_TABLET | Freq: Every day | ORAL | Status: DC

## 2013-07-21 MED ORDER — NITROGLYCERIN 2 % TD OINT
1.0000 [in_us] | TOPICAL_OINTMENT | Freq: Once | TRANSDERMAL | Status: AC
  Administered 2013-07-21: 1 [in_us] via TOPICAL
  Filled 2013-07-21: qty 1

## 2013-07-21 MED ORDER — VITAMIN D3 25 MCG (1000 UNIT) PO TABS
2000.0000 [IU] | ORAL_TABLET | Freq: Every day | ORAL | Status: DC
  Administered 2013-07-22: 2000 [IU] via ORAL
  Filled 2013-07-21: qty 2

## 2013-07-21 MED ORDER — ROSUVASTATIN CALCIUM 10 MG PO TABS: 10.0000 mg | ORAL_TABLET | ORAL | Status: DC

## 2013-07-21 MED ORDER — TRIAMTERENE-HCTZ 37.5-25 MG PO TABS
1.0000 | ORAL_TABLET | Freq: Every day | ORAL | Status: DC
  Administered 2013-07-22: 1 via ORAL
  Filled 2013-07-21 (×2): qty 1

## 2013-07-21 MED ORDER — DILTIAZEM HCL ER COATED BEADS 180 MG PO CP24
180.0000 mg | ORAL_CAPSULE | Freq: Every day | ORAL | Status: DC
  Administered 2013-07-22: 180 mg via ORAL
  Filled 2013-07-21: qty 1

## 2013-07-21 MED ORDER — METOPROLOL SUCCINATE ER 25 MG PO TB24
25.0000 mg | ORAL_TABLET | Freq: Every day | ORAL | Status: DC
  Administered 2013-07-22: 25 mg via ORAL
  Filled 2013-07-21: qty 1

## 2013-07-21 MED ORDER — ASPIRIN EC 325 MG PO TBEC
325.0000 mg | DELAYED_RELEASE_TABLET | Freq: Every day | ORAL | Status: DC
  Administered 2013-07-22: 325 mg via ORAL

## 2013-07-21 MED ORDER — TRIAMTERENE-HCTZ 37.5-25 MG PO TABS: 1.0000 | ORAL_TABLET | Freq: Every day | ORAL | Status: DC

## 2013-07-21 MED ORDER — PANTOPRAZOLE SODIUM 40 MG PO TBEC
40.0000 mg | DELAYED_RELEASE_TABLET | Freq: Every day | ORAL | Status: DC
  Administered 2013-07-22: 40 mg via ORAL

## 2013-07-21 MED ORDER — TAMSULOSIN HCL 0.4 MG PO CAPS
0.4000 mg | ORAL_CAPSULE | Freq: Every day | ORAL | Status: DC
  Administered 2013-07-21 – 2013-07-22 (×2): 0.4 mg via ORAL
  Filled 2013-07-21 (×2): qty 1

## 2013-07-21 MED ORDER — GI COCKTAIL ~~LOC~~: 30.0000 mL | Freq: Four times a day (QID) | ORAL | Status: DC | PRN

## 2013-07-21 MED ORDER — VITAMIN D3 50 MCG (2000 UT) PO TABS: 2000.0000 [IU] | ORAL_TABLET | Freq: Every day | ORAL | Status: DC

## 2013-07-21 NOTE — ED Notes (Signed)
Pt here from home with c/o chest pain and sob . Pain radiates to his back

## 2013-07-21 NOTE — ED Notes (Signed)
MD Docherty at bedside.

## 2013-07-21 NOTE — ED Notes (Signed)
Pt reports poor po intake of food and fluids

## 2013-07-21 NOTE — ED Provider Notes (Signed)
CSN: [id number]     Arrival date & time 07/21/13  1729 History   First MD Initiated Contact with Patient 07/21/13 1905     Chief Complaint  Patient presents with  . Chest Pain     (Consider location/radiation/quality/duration/timing/severity/associated sxs/prior Treatment) Patient is a 80 y.o. male presenting with chest pain. The history is provided by the patient and a friend. No language interpreter was used.  Chest Pain Pain location:  Substernal area and epigastric Pain quality: aching and dull   Radiates to: BL sides of chest. Pain radiates to the back: no   Pain severity:  Moderate Onset quality:  Sudden Duration:  5 hours Timing:  Intermittent Progression:  Waxing and waning Chronicity:  New Context: at rest   Relieved by: belching. Worsened by:  Nothing tried Ineffective treatments:  None tried Associated symptoms: nausea and shortness of breath   Associated symptoms: no abdominal pain, no back pain, no cough, no dizziness, no dysphagia, no fatigue, no fever, no headache, no numbness, not vomiting and no weakness   Associated symptoms comment:  Belching  Risk factors: aortic disease, coronary artery disease, high cholesterol, hypertension and male sex     Past Medical History  Diagnosis Date  . CAD (coronary artery disease)   . S/P CABG x 4 02/27/1994    LIMA to LAD,SVG to OM,SVG to 2nd OM,SVG to RCA-Dr. Cyndia Bent  . Heart murmur   . Carotid arterial disease     07/04/12 doppler:right ICA 50-69%,left bulb & ICA 0-49%  . Systemic hypertension   . Dyslipidemia   . Bilateral carotid artery stenosis 04/16/2013  . Aortic valve stenosis 04/16/2013    1.5 cm by echo March 2014  . Essential hypertension 04/16/2013    Did not tolerate higher doses of statins. Satisfactory control on once weekly Crestor    Past Surgical History  Procedure Laterality Date  . Coronary artery bypass graft  02/27/1994    LIMA to LAD,SVG to OM,SVG to 2nd OM,SVG to RCA  . Intraocular lens  insertion  2000  . Cardiac catheterization  02/25/1994  . US echocardiography  07/04/2012    mild to mod AOV stenosis,mitral annular ca+,LA mildly to mod. dilated  . Nm myoview ltd  04/01/2010    no ischemia, EF 60%   History reviewed. No pertinent family history. History  Substance Use Topics  . Smoking status: Former Smoker    Quit date: 05/03/1996  . Smokeless tobacco: Never Used  . Alcohol Use: No    Review of Systems  Constitutional: Negative for fever, activity change, appetite change and fatigue.  HENT: Negative for congestion, facial swelling, rhinorrhea and trouble swallowing.   Eyes: Negative for photophobia and pain.  Respiratory: Positive for shortness of breath. Negative for cough and chest tightness.   Cardiovascular: Positive for chest pain. Negative for leg swelling.  Gastrointestinal: Positive for nausea. Negative for vomiting, abdominal pain, diarrhea and constipation.  Endocrine: Negative for polydipsia and polyuria.  Genitourinary: Negative for dysuria, urgency, decreased urine volume and difficulty urinating.  Musculoskeletal: Negative for back pain and gait problem.  Skin: Negative for color change, rash and wound.  Allergic/Immunologic: Negative for immunocompromised state.  Neurological: Negative for dizziness, facial asymmetry, speech difficulty, weakness, numbness and headaches.  Psychiatric/Behavioral: Negative for confusion, decreased concentration and agitation.      Allergies  Ace inhibitors; Crestor; and Lipitor  Home Medications   Current Outpatient Rx  Name  Route  Sig  Dispense  Refill  . omeprazole (PRILOSEC) 40  MG capsule   Oral   Take 1 capsule (40 mg total) by mouth daily.   30 capsule   0    BP 142/62  Pulse 56  Temp(Src) 98.4 F (36.9 C) (Oral)  Resp 18  Ht 5\' 10"  (1.778 m)  Wt 168 lb 1.6 oz (76.25 kg)  BMI 24.12 kg/m2  SpO2 98% Physical Exam  Constitutional: He is oriented to person, place, and time. He appears  well-developed and well-nourished. No distress.  HENT:  Head: Normocephalic and atraumatic.  Mouth/Throat: No oropharyngeal exudate.  Eyes: Pupils are equal, round, and reactive to light.  Neck: Normal range of motion. Neck supple.  Cardiovascular: Normal rate, regular rhythm and normal heart sounds.  Exam reveals no gallop and no friction rub.   No murmur heard. Pulmonary/Chest: Effort normal and breath sounds normal. No respiratory distress. He has no wheezes. He has no rales.  Abdominal: Soft. Bowel sounds are normal. He exhibits no distension and no mass. There is no tenderness. There is no rebound and no guarding.  Musculoskeletal: Normal range of motion. He exhibits no edema and no tenderness.  Neurological: He is alert and oriented to person, place, and time.  Skin: Skin is warm and dry.  Psychiatric: He has a normal mood and affect.    ED Course  Procedures (including critical care time) Labs Review Labs Reviewed  COMPREHENSIVE METABOLIC PANEL - Abnormal; Notable for the following:    Glucose, Bld 114 (*)    BUN 29 (*)    GFR calc non Af Amer 53 (*)    GFR calc Af Amer 61 (*)    All other components within normal limits  CBC  TROPONIN I  TROPONIN I  TROPONIN I  TSH  I-STAT TROPOININ, ED   Imaging Review Dg Chest 2 View  07/21/2013   CLINICAL DATA:  Centralized chest pain, history coronary artery disease post CABG, hypertension, dyslipidemia  EXAM: CHEST  2 VIEW  COMPARISON:  07/02/2006  FINDINGS: Minimal enlargement of cardiac silhouette post CABG.  Calcified tortuous aorta.  Pulmonary vascularity normal.  Persistent eventration of anterior right diaphragm.  Emphysematous changes without infiltrate, pleural effusion or pneumothorax.  Diffuse osseous demineralization.  IMPRESSION: Minimal enlargement of cardiac silhouette post CABG.  Question COPD without acute abnormality.   Electronically Signed   By: Lavonia Dana M.D.   On: 07/21/2013 19:14     EKG  Interpretation None      Date: 07/21/2013  Rate: 70  Rhythm: normal sinus rhythm  QRS Axis: normal  Intervals: QRS prolonged  ST/T Wave abnormalities: nonspecific T wave changes  Conduction Disutrbances:right bundle branch block  Narrative Interpretation:   Old EKG Reviewed: uncahnged    MDM   Final diagnoses:  Chest pain  Aortic valve stenosis  Dyslipidemia  Essential hypertension  CAD s/p CABG 1995  Bilateral carotid artery stenosis  Onychomycosis  Pain in joint, ankle and foot    Pt is a 80 y.o. male with Pmhx as above who presents with intermittent CP since about 3p today, radiating to both sides, with assoc SOB & belching. Pt states sometimes pain more epigastric, sometimes more chest. Denies cough, fever, leg pain/swelling, recent illness. On PE, VSS, pt in NAD.  Cardiopulm exam benign. No LE edema. EKG w/ RBBB, unchanged.  Given hx of CABG/CAD, I feel he would benefit from ACS r/o. Cardiology consulted, and felt he was safe for IM admit.  [facility] accepting team.         Austin Ward  Arna Snipe, MD 07/22/13 1133

## 2013-07-21 NOTE — ED Notes (Signed)
Pt up to bathroom without any problems

## 2013-07-21 NOTE — Plan of Care (Signed)
Problem: Phase I Progression Outcomes Goal: Aspirin unless contraindicated Outcome: Completed/Met Date Met:  07/21/13 Pt given 324 mg of ASA in ED

## 2013-07-21 NOTE — H&P (Signed)
PATIENT DETAILS Name: Austin Ward Age: 80 y.o. Sex: male Date of Birth: [birth year] Admit Date: 07/21/2013 PCP:No primary provider on file.   CHIEF COMPLAINT:  Epigastric pain since this afternoon  HPI: Austin Ward is a 80 y.o. male with a Past Medical History of coronary disease status post CABG in 1995, hyperlipidemia, hypothyroidism, hypertension, mild aortic stenosis who presents today with the above noted complaint. Per patient, he was in his usual state of health this afternoon when he noted he was having epigastric discomfort. Per patient, this is mostly discomfort rather than pain, he cannot describe the nature of the discomfort. He claims that when he has his epigastric discomfort, it lasts for a few minutes. There is no radiation. There is no particular aggravating or relieving factors. It is associated with some mild shortness of breath. There is no associated nausea, vomiting or diaphoresis. Because of these symptoms he presented to the emergency room, initial EKG and cardiac enzymes were negative. I was subsequently asked to admit this patient for further evaluation and treatment. Please note, while in the emergency room patient has had no further epigastric discomfort. Patient denies any nausea, vomiting, headache, diarrhea.  ALLERGIES:   Allergies  Allergen Reactions  . Ace Inhibitors   . Crestor [Rosuvastatin] Other (See Comments)    Leg weakness  . Lipitor [Atorvastatin] Other (See Comments)    Leg weakness    PAST MEDICAL HISTORY: Past Medical History  Diagnosis Date  . CAD (coronary artery disease)   . S/P CABG x 4 02/27/1994    LIMA to LAD,SVG to OM,SVG to 2nd OM,SVG to RCA-Dr. Cyndia Bent  . Heart murmur   . Carotid arterial disease     07/04/12 doppler:right ICA 50-69%,left bulb & ICA 0-49%  . Systemic hypertension   . Dyslipidemia   . Bilateral carotid artery stenosis 04/16/2013  . Aortic valve stenosis 04/16/2013    1.5 cm by echo March  2014  . Essential hypertension 04/16/2013    Did not tolerate higher doses of statins. Satisfactory control on once weekly Crestor     PAST SURGICAL HISTORY: Past Surgical History  Procedure Laterality Date  . Coronary artery bypass graft  02/27/1994    LIMA to LAD,SVG to OM,SVG to 2nd OM,SVG to RCA  . Intraocular lens insertion  2000  . Cardiac catheterization  02/25/1994  . US echocardiography  07/04/2012    mild to mod AOV stenosis,mitral annular ca+,LA mildly to mod. dilated  . Nm myoview ltd  04/01/2010    no ischemia, EF 60%    MEDICATIONS AT HOME: Prior to Admission medications   Medication Sig Start Date End Date Taking? Authorizing Provider  acetaminophen (TYLENOL) 325 MG tablet Take 325 mg by mouth 2 (two) times daily.    Historical Provider, MD  alfuzosin (UROXATRAL) 10 MG 24 hr tablet  05/12/13   Historical Provider, MD  aspirin 81 MG tablet Take 81 mg by mouth daily.    Historical Provider, MD  Cholecalciferol (VITAMIN D3) 2000 UNITS TABS Take 2,000 Int'l Units by mouth daily.    Historical Provider, MD  diltiazem (CARTIA XT) 180 MG 24 hr capsule Take 1 capsule (180 mg total) by mouth daily. 05/08/13   Mihai Croitoru, MD  finasteride (PROSCAR) 5 MG tablet Take 5 mg by mouth daily.    Historical Provider, MD  KLOR-CON 10 10 MEQ tablet TAKE 1 TABLET BY MOUTH EVERY DAY 05/08/13   Mihai Croitoru, MD  metoprolol succinate (TOPROL-XL) 25 MG  24 hr tablet TAKE 1 TABLET BY MOUTH ONCE DAILY 05/08/13   Mihai Croitoru, MD  omeprazole (PRILOSEC) 20 MG capsule Take 20 mg by mouth daily.    Historical Provider, MD  propylthiouracil (PTU) 50 MG tablet Take 50 mg by mouth 2 (two) times daily.  09/06/12   Historical Provider, MD  rosuvastatin (CRESTOR) 10 MG tablet Take 1 tablet (10 mg total) by mouth every 7 (seven) days. 05/22/13   Mihai Croitoru, MD  sod citrate-citric acid (ORACIT) 490-640 MG/5ML SOLN Take 15 mLs by mouth 2 (two) times daily after a meal. Take 3 tablespoons in the morning and 2  tablespoons at night    Historical Provider, MD  tamsulosin (FLOMAX) 0.4 MG CAPS Take 0.4 mg by mouth daily.    Historical Provider, MD  triamcinolone cream (KENALOG) 0.1 %  04/19/13   Historical Provider, MD  triamterene-hydrochlorothiazide (MAXZIDE-25) 37.5-25 MG per tablet TAKE 1 TABLET BY MOUTH ONCE DAILY 05/08/13   Sanda Klein, MD    FAMILY HISTORY: History reviewed. No pertinent family history.  SOCIAL HISTORY:  reports that he quit smoking about 17 years ago. He has never used smokeless tobacco. He reports that he does not drink alcohol. His drug history is not on file.  REVIEW OF SYSTEMS:  Constitutional:   No  weight loss, night sweats,  Fevers, chills, fatigue.  HEENT:    No headaches, Difficulty swallowing,Tooth/dental problems,Sore throat,  No sneezing, itching, ear ache, nasal congestion, post nasal drip,   Cardio-vascular: No chest pain,  Orthopnea, PND, swelling in lower extremities, anasarca,         dizziness, palpitations  GI:  No  nausea, vomiting, diarrhea, change in  bowel habits, loss of appetite  Resp: No shortness of breath with exertion .  No excess mucus, no productive cough, No non-productive cough,  No coughing up of blood.No change in color of mucus.No wheezing.No chest wall deformity  Skin:  no rash or lesions.  GU:  no dysuria, change in color of urine, no urgency or frequency.  No flank pain.  Musculoskeletal: No joint pain or swelling.  No decreased range of motion.  No back pain.  Psych: No change in mood or affect. No depression or anxiety.  No memory loss.   PHYSICAL EXAM: Blood pressure 154/45, pulse 64, temperature 98.3 F (36.8 C), temperature source Oral, resp. rate 16, SpO2 100.00%.  General appearance :Awake, alert, not in any distress. Speech Clear. Not toxic Looking HEENT: Atraumatic and Normocephalic, pupils equally reactive to light and accomodation Neck: supple, no JVD. No cervical lymphadenopathy.  Chest:Good air entry  bilaterally, no added sounds  CVS: S1 S2 regular, no murmurs.  Abdomen: Bowel sounds present, very minimal epigastric tenderness,  with no gaurding, rigidity or rebound. No abdominal distention Extremities: B/L Lower Ext shows no edema, both legs are warm to touch Neurology: Awake alert, and oriented X 3, CN II-XII intact, Non focal Skin:No Rash Wounds:N/A  LABS ON ADMISSION:   Recent Labs  07/21/13 1739  NA 141  K 3.9  CL 101  CO2 26  GLUCOSE 114*  BUN 29*  CREATININE 1.19  CALCIUM 9.5    Recent Labs  07/21/13 1739  AST 12  ALT 5  ALKPHOS 54  BILITOT 0.7  PROT 6.6  ALBUMIN 3.6   No results found for this basename: LIPASE, AMYLASE,  in the last 72 hours  Recent Labs  07/21/13 1739  WBC 8.7  HGB 14.9  HCT 42.4  MCV 88.1  PLT  213   No results found for this basename: CKTOTAL, CKMB, CKMBINDEX, TROPONINI,  in the last 72 hours No results found for this basename: DDIMER,  in the last 72 hours No components found with this basename: POCBNP,    RADIOLOGIC STUDIES ON ADMISSION: Dg Chest 2 View  07/21/2013   CLINICAL DATA:  Centralized chest pain, history coronary artery disease post CABG, hypertension, dyslipidemia  EXAM: CHEST  2 VIEW  COMPARISON:  07/02/2006  FINDINGS: Minimal enlargement of cardiac silhouette post CABG.  Calcified tortuous aorta.  Pulmonary vascularity normal.  Persistent eventration of anterior right diaphragm.  Emphysematous changes without infiltrate, pleural effusion or pneumothorax.  Diffuse osseous demineralization.  IMPRESSION: Minimal enlargement of cardiac silhouette post CABG.  Question COPD without acute abnormality.   Electronically Signed   By: Lavonia Dana M.D.   On: 07/21/2013 19:14    EKG: Independently reviewed. Normal sinus rhythm-right bundle branch block pattern  ASSESSMENT AND PLAN: Present on Admission:  . Chest pain - Atypical, however a known history of CAD. Suspect this is more of GI etiology.  - Will admit, place on  aspirin, place on PPI, cycle cardiac enzymes. Monitor in telemetry.  - Check a 2-D echocardiogram, if no wall motion abnormality, suspect can be discharged home with further workup to be done in the outpatient setting.   . Aortic valve stenosis - Reviewed outpatient cardiology note, mild aortic stenosis. Continue periodic surveillance at the discretion of outpatient cardiology.  . Bilateral carotid artery stenosis -Continue periodic surveillance at the discretion of outpatient cardiology.  Marland Kitchen CAD s/p CABG 1995 - Will be on aspirin, statin and beta blockers. See above.   . Dyslipidemia -  continue statin  . Essential hypertension - Continue usual antihypertensive medications-followed BP trend and adjust accordingly.  Further plan will depend as patient's clinical course evolves and further radiologic and laboratory data become available. Patient will be monitored closely.  Above noted plan was discussed with patient/neighbors (after patient's permission) , they were in agreement.   DVT Prophylaxis: Prophylactic Lovenox   Code Status: Full Code- however does not wish to be on "life support for a long time"  Total time spent for admission equals 45 minutes.  [facility] Pager [phone]  If 7PM-7AM, please contact night-coverage [email or web address] Password [facility] 07/21/2013, 8:36 PM

## 2013-07-21 NOTE — ED Notes (Signed)
Pt reports he is having "heart problem." Pt reports Left side chest pain radiating to his back approx 1400 today. Pt also reports having hand tremors x15 years ago that started after a bypass surgery 15 years ago.

## 2013-07-22 LAB — TROPONIN I: Troponin I: <0.3 ng/mL (ref <0.30)

## 2013-07-22 LAB — TSH: TSH: 0.463 u[IU]/mL (ref 0.350–4.500)

## 2013-07-22 MED ORDER — OMEPRAZOLE 40 MG PO CPDR: 40.0000 mg | DELAYED_RELEASE_CAPSULE | Freq: Every day | ORAL | Status: DC

## 2013-07-22 NOTE — Progress Notes (Signed)
Utilization Review Completed.Maudy Yonan T3/21/2015  

## 2013-07-22 NOTE — Progress Notes (Signed)
Pt's hr dropped to the 30s non-sustaining. Pt asymptomatic & asleep. MD on call notified. Will continue to monitor the pt. Hoover Brunette

## 2013-07-22 NOTE — Consult Note (Signed)
CARDIOLOGY CONSULT NOTE  Patient ID: Austin Ward MRN: [id number] DOB/AGE: 1924-03-03 80 y.o.  Admit date: 07/21/2013 Primary Physician None Primary Cardiologist   Dr. Sallyanne Kuster Chief Complaint  Chest pain  HPI:   The patient presents with abdominal pain.  This started yesterday.  It was in the mid abdomen with some radiation under both ribs.  It was somewhat sharp and moderate.  He reports a poor memory and says that he cannot recall all of the details today.  However, he denies chest pain.  He has had no radiation to his jaw or arms.  He denies any associated symptoms although the ER mentions burping.  He reports that 2 weeks ago he did some yard work and developed some pain in his right posterior shoulder.  Otherwise he says that he has felt well. The patient denies any new symptoms such as chest discomfort, neck or arm discomfort. There has been no new shortness of breath, PND or orthopnea. There have been no reported palpitations, presyncope or syncope.  Of note the patient was reported to have had heart rates in the 30s while asleep in the ER .  He has had no symptoms related to this.      Past Medical History  Diagnosis Date  . CAD (coronary artery disease)   . S/P CABG x 4 02/27/1994    LIMA to LAD,SVG to OM,SVG to 2nd OM,SVG to RCA-Dr. Cyndia Bent  . Heart murmur   . Carotid arterial disease     07/04/12 doppler:right ICA 50-69%,left bulb & ICA 0-49%  . Systemic hypertension   . Dyslipidemia   . Bilateral carotid artery stenosis 04/16/2013  . Aortic valve stenosis 04/16/2013    1.5 cm by echo March 2014  . Essential hypertension 04/16/2013    Did not tolerate higher doses of statins. Satisfactory control on once weekly Crestor     Past Surgical History  Procedure Laterality Date  . Coronary artery bypass graft  02/27/1994    LIMA to LAD,SVG to OM,SVG to 2nd OM,SVG to RCA  . Intraocular lens insertion  2000  . Cardiac catheterization  02/25/1994  . US echocardiography   07/04/2012    mild to mod AOV stenosis,mitral annular ca+,LA mildly to mod. dilated  . Nm myoview ltd  04/01/2010    no ischemia, EF 60%    Allergies  Allergen Reactions  . Ace Inhibitors   . Crestor [Rosuvastatin] Other (See Comments)    Leg weakness  . Lipitor [Atorvastatin] Other (See Comments)    Leg weakness   Prescriptions prior to admission  Medication Sig Dispense Refill  . acetaminophen (TYLENOL) 325 MG tablet Take 325 mg by mouth 2 (two) times daily.      Marland Kitchen alfuzosin (UROXATRAL) 10 MG 24 hr tablet       . aspirin 81 MG tablet Take 81 mg by mouth daily.      . Cholecalciferol (VITAMIN D3) 2000 UNITS TABS Take 2,000 Int'l Units by mouth daily.      Marland Kitchen diltiazem (CARTIA XT) 180 MG 24 hr capsule Take 1 capsule (180 mg total) by mouth daily.  30 capsule  6  . finasteride (PROSCAR) 5 MG tablet Take 5 mg by mouth daily.      Marland Kitchen KLOR-CON 10 10 MEQ tablet TAKE 1 TABLET BY MOUTH EVERY DAY  30 tablet  6  . metoprolol succinate (TOPROL-XL) 25 MG 24 hr tablet TAKE 1 TABLET BY MOUTH ONCE DAILY  30 tablet  6  . omeprazole (  PRILOSEC) 20 MG capsule Take 20 mg by mouth daily.      Marland Kitchen propylthiouracil (PTU) 50 MG tablet Take 50 mg by mouth 2 (two) times daily.       . rosuvastatin (CRESTOR) 10 MG tablet Take 1 tablet (10 mg total) by mouth every 7 (seven) days.  21 tablet  0  . sod citrate-citric acid (ORACIT) 490-640 MG/5ML SOLN Take 15 mLs by mouth 2 (two) times daily after a meal. Take 3 tablespoons in the morning and 2 tablespoons at night      . tamsulosin (FLOMAX) 0.4 MG CAPS Take 0.8 mg by mouth daily. At bedtime      . triamcinolone cream (KENALOG) 0.1 %       . triamterene-hydrochlorothiazide (MAXZIDE-25) 37.5-25 MG per tablet TAKE 1 TABLET BY MOUTH ONCE DAILY  30 tablet  6   History reviewed. No pertinent family history.  History   Social History  . Marital Status: Single    Spouse Name: N/A    Number of Children: N/A  . Years of Education: N/A   Occupational History  . Not on  file.   Social History Main Topics  . Smoking status: Former Smoker    Quit date: 05/03/1996  . Smokeless tobacco: Never Used  . Alcohol Use: No  . Drug Use: Not on file  . Sexual Activity: Not on file   Other Topics Concern  . Not on file   Social History Narrative  . No narrative on file     ROS:    As stated in the HPI and negative for all other systems.  Physical Exam: Blood pressure 133/57, pulse 62, temperature 98.5 F (36.9 C), temperature source Oral, resp. rate 18, height 5\' 10"  (1.778 m), weight 168 lb 1.6 oz (76.25 kg), SpO2 94.00%.  GENERAL:  Well appearing HEENT:  Pupils equal round and reactive, fundi not visualized, oral mucosa unremarkable NECK:  No jugular venous distention, waveform within normal limits, carotid upstroke brisk and symmetric, bilateral bruits, no thyromegaly LYMPHATICS:  No cervical, inguinal adenopathy LUNGS:  Clear to auscultation bilaterally BACK:  No CVA tenderness CHEST:  Unremarkable HEART:  PMI not displaced or sustained,S1 and S2 within normal limits, no S3, no S4, no clicks, no rubs, 2/6 apical systolic murmur radiating out the outflow tract, no diastolic murmurs ABD:  Flat, positive bowel sounds normal in frequency in pitch, no bruits, no rebound, no guarding, no midline pulsatile mass, no hepatomegaly, no splenomegaly EXT:  2 plus pulses throughout, no edema, no cyanosis no clubbing SKIN:  No rashes no nodules NEURO:  Cranial nerves II through XII grossly intact, motor grossly intact throughout PSYCH:  Cognitively intact, oriented to person place and time   Labs: Lab Results  Component Value Date   BUN 29* 07/21/2013   Lab Results  Component Value Date   CREATININE 1.19 07/21/2013   Lab Results  Component Value Date   NA 141 07/21/2013   K 3.9 07/21/2013   CL 101 07/21/2013   CO2 26 07/21/2013   Lab Results  Component Value Date   TROPONINI <0.30 07/22/2013   Lab Results  Component Value Date   WBC 8.7 07/21/2013   HGB  14.9 07/21/2013   HCT 42.4 07/21/2013   MCV 88.1 07/21/2013   PLT 213 07/21/2013    Lab Results  Component Value Date   ALT 5 07/21/2013   AST 12 07/21/2013   ALKPHOS 54 07/21/2013   BILITOT 0.7 07/21/2013  Radiology:   CXR: Minimal enlargement of cardiac silhouette post CABG.  Question COPD without acute abnormality.  EKG:NSR, rate 70, axis WNL, RBBB, no acute ST T wave changes.    ASSESSMENT AND PLAN:   ABDOMINAL PAIN: I do not think that this represents an anginal equivalent.  I would not suggest that in patient stress testing is indicated.  However, given his past history he can follow with Dr. Sallyanne Kuster who can consider out patient Lexiscan Myoview.  HTN:  Continue current meds.    BRADYCARDIA:  No symptoms.  No change in therapy at this point.    AS:  This has been mild in the past.  Follow up per Dr. Loletha Grayer.  CAROTID STENOSIS:  Moderate right stenosis earlier this year.  Follow up in one year.   SignedMinus Breeding 07/22/2013, 8:03 AM

## 2013-07-22 NOTE — Discharge Instructions (Signed)
Follow with Primary MD  in 7 days   Get CBC, CMP, checked 7 days by Primary MD and again as instructed by your Primary MD. Get a 2 view Chest X ray done next visit .   Activity: As tolerated with Full fall precautions use walker/cane & assistance as needed   Disposition Home    Diet: Heart Healthy   For Heart failure patients - Check your Weight same time everyday, if you gain over 2 pounds, or you develop in leg swelling, experience more shortness of breath or chest pain, call your Primary MD immediately. Follow Cardiac Low Salt Diet and 1.8 lit/day fluid restriction.   On your next visit with her primary care physician please Get Medicines reviewed and adjusted.  Please request your Prim.MD to go over all Hospital Tests and Procedure/Radiological results at the follow up, please get all Hospital records sent to your Prim MD by signing hospital release before you go home.   If you experience worsening of your admission symptoms, develop shortness of breath, life threatening emergency, suicidal or homicidal thoughts you must seek medical attention immediately by calling 911 or calling your MD immediately  if symptoms less severe.  You Must read complete instructions/literature along with all the possible adverse reactions/side effects for all the Medicines you take and that have been prescribed to you. Take any new Medicines after you have completely understood and accpet all the possible adverse reactions/side effects.   Do not drive and provide baby sitting services if your were admitted for syncope or siezures until you have seen by Primary MD or a Neurologist and advised to do so again.  Do not drive when taking Pain medications.    Do not take more than prescribed Pain, Sleep and Anxiety Medications  Special Instructions: If you have smoked or chewed Tobacco  in the last 2 yrs please stop smoking, stop any regular Alcohol  and or any Recreational drug use.  Wear Seat belts  while driving.   Please note  You were cared for by a hospitalist during your hospital stay. If you have any questions about your discharge medications or the care you received while you were in the hospital after you are discharged, you can call the unit and asked to speak with the hospitalist on call if the hospitalist that took care of you is not available. Once you are discharged, your primary care physician will handle any further medical issues. Please note that NO REFILLS for any discharge medications will be authorized once you are discharged, as it is imperative that you return to your primary care physician (or establish a relationship with a primary care physician if you do not have one) for your aftercare needs so that they can reassess your need for medications and monitor your lab values.

## 2013-07-22 NOTE — Discharge Summary (Signed)
Austin Ward, is a 80 y.o. male  DOB Apr 27, 1924  MRN [id number].  Admission date:  07/21/2013  Admitting Physician  Jonetta Osgood, MD  Discharge Date:  07/22/2013   Primary MD  No primary provider on file.  Recommendations for primary care physician for things to follow:   Please make sure patient follows with recommended cardiologist   Admission Diagnosis  Aortic valve stenosis [424.1] Dyslipidemia [272.4] Essential hypertension [401.9] Chest pain [786.50]   Discharge Diagnosis  Aortic valve stenosis [424.1] Dyslipidemia [272.4] Essential hypertension [401.9] Chest pain [786.50]    Principal Problem:   Chest pain Active Problems:   CAD s/p CABG 1995   Bilateral carotid artery stenosis   Aortic valve stenosis   Essential hypertension   Dyslipidemia      Past Medical History  Diagnosis Date  . CAD (coronary artery disease)   . S/P CABG x 4 02/27/1994    LIMA to LAD,SVG to OM,SVG to 2nd OM,SVG to RCA-Dr. Cyndia Bent  . Heart murmur   . Carotid arterial disease     07/04/12 doppler:right ICA 50-69%,left bulb & ICA 0-49%  . Systemic hypertension   . Dyslipidemia   . Bilateral carotid artery stenosis 04/16/2013  . Aortic valve stenosis 04/16/2013    1.5 cm by echo March 2014  . Essential hypertension 04/16/2013    Did not tolerate higher doses of statins. Satisfactory control on once weekly Crestor     Past Surgical History  Procedure Laterality Date  . Coronary artery bypass graft  02/27/1994    LIMA to LAD,SVG to OM,SVG to 2nd OM,SVG to RCA  . Intraocular lens insertion  2000  . Cardiac catheterization  02/25/1994  . US echocardiography  07/04/2012    mild to mod AOV stenosis,mitral annular ca+,LA mildly to mod. dilated  . Nm myoview ltd  04/01/2010    no ischemia, EF 60%     Discharge Condition:  Stable   Follow UP  Follow-up Information   Follow up with Sanda Klein, MD. (Our office will call you for a follow-up appointment. Please call the office if you have not heard from [place] within 3 days.)    Specialty:  Cardiology   Contact information:   [address] [place] [address] [phone]       Follow up with Your PCP. Schedule an appointment as soon as possible for a visit in 1 week.        Discharge Instructions  and  Discharge Medications     Discharge Orders   Future Appointments Provider Department Dept Phone   09/11/2013 1:45 PM Myeong Julian Reil [facility] [phone]   Future Orders Complete By Expires   Diet - low sodium heart healthy  As directed    Discharge instructions  As directed    Comments:     Follow with Primary MD  in 7 days   Get CBC, CMP, checked 7 days by Primary MD and again as instructed by your Primary MD. Get a 2 view Chest  X ray done next visit .   Activity: As tolerated with Full fall precautions use walker/cane & assistance as needed   Disposition Home    Diet: Heart Healthy   For Heart failure patients - Check your Weight same time everyday, if you gain over 2 pounds, or you develop in leg swelling, experience more shortness of breath or chest pain, call your Primary MD immediately. Follow Cardiac Low Salt Diet and 1.8 lit/day fluid restriction.   On your next visit with her primary care physician please Get Medicines reviewed and adjusted.  Please request your Prim.MD to go over all Hospital Tests and Procedure/Radiological results at the follow up, please get all Hospital records sent to your Prim MD by signing hospital release before you go home.   If you experience worsening of your admission symptoms, develop shortness of breath, life threatening emergency, suicidal or homicidal thoughts you must seek medical attention immediately by calling 911 or calling your MD immediately  if symptoms less  severe.  You Must read complete instructions/literature along with all the possible adverse reactions/side effects for all the Medicines you take and that have been prescribed to you. Take any new Medicines after you have completely understood and accpet all the possible adverse reactions/side effects.   Do not drive and provide baby sitting services if your were admitted for syncope or siezures until you have seen by Primary MD or a Neurologist and advised to do so again.  Do not drive when taking Pain medications.    Do not take more than prescribed Pain, Sleep and Anxiety Medications  Special Instructions: If you have smoked or chewed Tobacco  in the last 2 yrs please stop smoking, stop any regular Alcohol  and or any Recreational drug use.  Wear Seat belts while driving.   Please note  You were cared for by a hospitalist during your hospital stay. If you have any questions about your discharge medications or the care you received while you were in the hospital after you are discharged, you can call the unit and asked to speak with the hospitalist on call if the hospitalist that took care of you is not available. Once you are discharged, your primary care physician will handle any further medical issues. Please note that NO REFILLS for any discharge medications will be authorized once you are discharged, as it is imperative that you return to your primary care physician (or establish a relationship with a primary care physician if you do not have one) for your aftercare needs so that they can reassess your need for medications and monitor your lab values.   Increase activity slowly  As directed        Medication List         acetaminophen 325 MG tablet  Commonly known as:  TYLENOL  Take 325 mg by mouth 2 (two) times daily.     alfuzosin 10 MG 24 hr tablet  Commonly known as:  UROXATRAL     aspirin 81 MG tablet  Take 81 mg by mouth daily.     diltiazem 180 MG 24 hr capsule    Commonly known as:  CARTIA XT  Take 1 capsule (180 mg total) by mouth daily.     finasteride 5 MG tablet  Commonly known as:  PROSCAR  Take 5 mg by mouth daily.     KLOR-CON 10 10 MEQ tablet  Generic drug:  potassium chloride  TAKE 1 TABLET BY MOUTH EVERY DAY  metoprolol succinate 25 MG 24 hr tablet  Commonly known as:  TOPROL-XL  TAKE 1 TABLET BY MOUTH ONCE DAILY     omeprazole 40 MG capsule  Commonly known as:  PRILOSEC  Take 1 capsule (40 mg total) by mouth daily.     ORACIT 490-640 MG/5ML Soln  Generic drug:  sod citrate-citric acid  Take 15 mLs by mouth 2 (two) times daily after a meal. Take 3 tablespoons in the morning and 2 tablespoons at night     propylthiouracil 50 MG tablet  Commonly known as:  PTU  Take 50 mg by mouth 2 (two) times daily.     rosuvastatin 10 MG tablet  Commonly known as:  CRESTOR  Take 1 tablet (10 mg total) by mouth every 7 (seven) days.     tamsulosin 0.4 MG Caps capsule  Commonly known as:  FLOMAX  Take 0.8 mg by mouth daily. At bedtime     triamcinolone cream 0.1 %  Commonly known as:  KENALOG     triamterene-hydrochlorothiazide 37.5-25 MG per tablet  Commonly known as:  MAXZIDE-25  TAKE 1 TABLET BY MOUTH ONCE DAILY     Vitamin D3 2000 UNITS Tabs  Take 2,000 Int'l Units by mouth daily.          Diet and Activity recommendation: See Discharge Instructions above   Consults obtained - cardiology   Major procedures and Radiology Reports - PLEASE review detailed and final reports for all details, in brief -       Dg Chest 2 View  07/21/2013   CLINICAL DATA:  Centralized chest pain, history coronary artery disease post CABG, hypertension, dyslipidemia  EXAM: CHEST  2 VIEW  COMPARISON:  07/02/2006  FINDINGS: Minimal enlargement of cardiac silhouette post CABG.  Calcified tortuous aorta.  Pulmonary vascularity normal.  Persistent eventration of anterior right diaphragm.  Emphysematous changes without infiltrate, pleural  effusion or pneumothorax.  Diffuse osseous demineralization.  IMPRESSION: Minimal enlargement of cardiac silhouette post CABG.  Question COPD without acute abnormality.   Electronically Signed   By: Lavonia Dana M.D.   On: 07/21/2013 19:14    Micro Results      No results found for this or any previous visit (from the past 240 hour(s)).   History of present illness and  Hospital Course:     Kindly see H&P for history of present illness and admission details, please review complete Labs, Consult reports and Test reports for all details in brief Austin Ward, is a 80 y.o. male, patient with history of   CAD status post CABG, bilateral carotid artery stenosis, hypertension, dyslipidemia, hypothyroidism, mild aortic stenosis presented to the hospital with chief complaints of atypical chest pain, EKG showed old right bundle-branch block, visits of troponin were negative, was seen by cardiologist Dr.Hochrien and cleared for discharge home. He'll follow with cardiologist Dr. Marlou Porch for outpatient stress test. Please chest pain and symptom free this morning.   He will commence his home medications unchanged for his chronic medical issues which include CAD, dyslipidemia, hypertension, hypothyroidism and mild aortic stenosis and follow with his PCP in a week's time. This has been given to him in writing.      Today   Subjective:   Emersen Carroll today has no headache,no chest abdominal pain,no new weakness tingling or numbness, feels much better wants to go home today.    Objective:   Blood pressure 133/57, pulse 62, temperature 98.5 F (36.9 C), temperature source Oral, resp. rate 18, height  5\' 10"  (1.778 m), weight 76.25 kg (168 lb 1.6 oz), SpO2 94.00%.  No intake or output data in the 24 hours ending 07/22/13 0935  Exam Awake Alert, Oriented *3, No new F.N deficits, Normal affect [place].AT,PERRAL Supple Neck,No JVD, No cervical lymphadenopathy appriciated.  Symmetrical Chest  wall movement, Good air movement bilaterally, CTAB RRR,No Gallops,Rubs or new Murmurs, No Parasternal Heave +ve B.Sounds, Abd Soft, Non tender, No organomegaly appriciated, No rebound -guarding or rigidity. No Cyanosis, Clubbing or edema, No new Rash or bruise  Data Review   CBC w Diff:  Lab Results  Component Value Date   WBC 8.7 07/21/2013   HGB 14.9 07/21/2013   HCT 42.4 07/21/2013   PLT 213 07/21/2013    CMP:  Lab Results  Component Value Date   NA 141 07/21/2013   K 3.9 07/21/2013   CL 101 07/21/2013   CO2 26 07/21/2013   BUN 29* 07/21/2013   CREATININE 1.19 07/21/2013   CREATININE 1.14 04/19/2013   PROT 6.6 07/21/2013   ALBUMIN 3.6 07/21/2013   BILITOT 0.7 07/21/2013   ALKPHOS 54 07/21/2013   AST 12 07/21/2013   ALT 5 07/21/2013  .   Total Time in preparing paper work, data evaluation and todays exam - 35 minutes  Thurnell Lose M.D on 07/22/2013 at 9:35 AM  [place]  [phone]

## 2013-07-22 NOTE — Progress Notes (Signed)
I have left a message on our office's scheduling voicemail requesting a follow-up appointment, and our office will call the patient with this appointment. Monti Villers PA-C  

## 2013-07-25 ENCOUNTER — Telehealth: Payer: Self-pay | Admitting: Cardiovascular Disease

## 2013-07-31 ENCOUNTER — Ambulatory Visit (INDEPENDENT_AMBULATORY_CARE_PROVIDER_SITE_OTHER): Payer: Medicare Other | Admitting: Cardiology

## 2013-07-31 ENCOUNTER — Ambulatory Visit: Payer: Medicare Other | Admitting: Cardiology

## 2013-07-31 VITALS — BP 118/60 | HR 68 | Ht 70.0 in | Wt 173.0 lb

## 2013-07-31 DIAGNOSIS — I2510 Atherosclerotic heart disease of native coronary artery without angina pectoris: Secondary | ICD-10-CM

## 2013-07-31 DIAGNOSIS — K219 Gastro-esophageal reflux disease without esophagitis: Secondary | ICD-10-CM

## 2013-07-31 NOTE — Patient Instructions (Signed)
Continue meds as directed.  F/u with Dr. Sallyanne Kuster in May or June

## 2013-08-04 ENCOUNTER — Encounter: Payer: Self-pay | Admitting: Cardiology

## 2013-08-04 DIAGNOSIS — K219 Gastro-esophageal reflux disease without esophagitis: Secondary | ICD-10-CM | POA: Insufficient documentation

## 2013-08-04 NOTE — Assessment & Plan Note (Addendum)
No recent angina. Continue ASA, BB and statin. Ischemic eval not  indicated.

## 2013-08-04 NOTE — Progress Notes (Signed)
Patient ID: Austin Ward, male   DOB: 1923/10/07, 80 y.o.   MRN: [id number]    08/04/2013 Kathryn Linarez   1923/10/14  [id number]  Primary Physicia No PCP Per Patient Primary Cardiologist: Dr. Sallyanne Kuster  HPI:  Austin Ward is a 80 y/o male, followed by Dr. Sallyanne Kuster, with a history of CAD. It has been almost 20 years since he underwent bypass surgery. He has not required repeat catheterization or any revascularization procedure since his initial bypass surgery. He had a normal nuclear stress test in 2011. He has normal left ventricular systolic function. An echo performed in March of 2014 showed mild to moderate aortic valve stenosis with a valve area of 1.5 cm square and a mean gradient of 18 mm Hg. He has bilateral carotid disease, more severe on the right where it is 50-70% in severity, stable however by serial studies. He takes once weekly Crestor which does a suboptimal job of controlling his LDL cholesterol but he has been intolerant to higher doses or any other statins.  He presented to [facility] on 07/22/13 with a complaint of mid abdominal pain with radiation to both ribs. It was somewhat sharp and moderate. He denied chest pain. No radiation to the arms or jaw. No associated SOB. The pain was worse after eating. No change with exertion. His EKG showed NSR w/o any acute changes. Troponins were negative x 3. It was suspected that his pain was more GI related. A PPI was initiated and he had significant improvement. He was seen in consult by Dr. Percival Spanish, who agreed that his pain symptoms were more consistent with GERD. He was discharged home on Protonix.  He returns to clinic today for post-hospital f/u. He reports that he has been doing well. He has refrained from certain foods such as tomatoes and tomato based products and denies any further recurrence when these foods are avoided. He reports compliance with  Protonix. He continues do deny any chest pain and SOB.    Current Outpatient  Prescriptions  Medication Sig Dispense Refill  . acetaminophen (TYLENOL) 325 MG tablet Take 1,000 mg by mouth daily.       Marland Kitchen aspirin 81 MG tablet Take 81 mg by mouth daily.      . Cholecalciferol (VITAMIN D3) 2000 UNITS TABS Take 2,000 Int'l Units by mouth daily.      Marland Kitchen diltiazem (CARTIA XT) 180 MG 24 hr capsule Take 1 capsule (180 mg total) by mouth daily.  30 capsule  6  . finasteride (PROSCAR) 5 MG tablet Take 5 mg by mouth daily.      Marland Kitchen KLOR-CON 10 10 MEQ tablet TAKE 1 TABLET BY MOUTH EVERY DAY  30 tablet  6  . metoprolol succinate (TOPROL-XL) 25 MG 24 hr tablet TAKE 1 TABLET BY MOUTH ONCE DAILY  30 tablet  6  . omeprazole (PRILOSEC) 40 MG capsule Take 1 capsule (40 mg total) by mouth daily.  30 capsule  0  . propylthiouracil (PTU) 50 MG tablet Take 50 mg by mouth 2 (two) times daily.       . rosuvastatin (CRESTOR) 10 MG tablet Take 1 tablet (10 mg total) by mouth every 7 (seven) days.  21 tablet  0  . sod citrate-citric acid (ORACIT) 490-640 MG/5ML SOLN Take 15 mLs by mouth 2 (two) times daily after a meal. Take 3 tablespoons in the morning and 2 tablespoons at night      . tamsulosin (FLOMAX) 0.4 MG CAPS Take 0.8 mg by mouth daily. At  bedtime      . triamcinolone cream (KENALOG) 0.1 %       . triamterene-hydrochlorothiazide (MAXZIDE-25) 37.5-25 MG per tablet TAKE 1 TABLET BY MOUTH ONCE DAILY  30 tablet  6   No current facility-administered medications for this visit.    Allergies  Allergen Reactions  . Ace Inhibitors   . Crestor [Rosuvastatin] Other (See Comments)    Leg weakness  . Lipitor [Atorvastatin] Other (See Comments)    Leg weakness    History   Social History  . Marital Status: Single    Spouse Name: N/A    Number of Children: N/A  . Years of Education: N/A   Occupational History  . Not on file.   Social History Main Topics  . Smoking status: Former Smoker    Quit date: 05/03/1996  . Smokeless tobacco: Never Used  . Alcohol Use: No  . Drug Use: Not on file   . Sexual Activity: Not on file   Other Topics Concern  . Not on file   Social History Narrative  . No narrative on file     Review of Systems: General: negative for chills, fever, night sweats or weight changes.  Cardiovascular: negative for chest pain, dyspnea on exertion, edema, orthopnea, palpitations, paroxysmal nocturnal dyspnea or shortness of breath Dermatological: negative for rash Respiratory: negative for cough or wheezing Urologic: negative for hematuria Abdominal: negative for nausea, vomiting, diarrhea, bright red blood per rectum, melena, or hematemesis Neurologic: negative for visual changes, syncope, or dizziness All other systems reviewed and are otherwise negative except as noted above.    Blood pressure 118/60, pulse 68, height 5\' 10"  (1.778 m), weight 173 lb (78.472 kg).  General appearance: alert, cooperative and no distress Neck: no JVD Lungs: clear to auscultation bilaterally Heart: regular rate and rhythm, S1, S2 normal, no murmur, click, rub or gallop Abdomen: soft, non-tender; bowel sounds normal; no masses,  no organomegaly Extremities: no LEE Pulses: 2+ and symmetric Skin: warm and dry Neurologic: Grossly normal    ASSESSMENT AND PLAN:   CAD s/p CABG 1995 No recent angina. Continue ASA, BB and statin. Ischemic eval not  indicated.  GERD (gastroesophageal reflux disease) Better controlled, per patient. Continue PPI. Pt knows what foods to avoid to prevent recurrent symptoms.      PLAN  No change in current treatment plan. Continue yearly f/u visits with Dr. Sallyanne Kuster. Pt instructed to f/u sooner if needed.   [place], [place] 08/04/2013 6:07 PM

## 2013-08-04 NOTE — Assessment & Plan Note (Signed)
Better controlled, per patient. Continue PPI. Pt knows what foods to avoid to prevent recurrent symptoms.

## 2013-08-24 NOTE — Telephone Encounter (Signed)
Closed encounter °

## 2013-09-11 ENCOUNTER — Encounter: Payer: Self-pay | Admitting: Podiatry

## 2013-09-11 ENCOUNTER — Ambulatory Visit (INDEPENDENT_AMBULATORY_CARE_PROVIDER_SITE_OTHER): Payer: Medicare Other | Admitting: Podiatry

## 2013-09-11 VITALS — BP 125/48 | HR 56 | Ht 70.0 in | Wt 173.0 lb

## 2013-09-11 DIAGNOSIS — B351 Tinea unguium: Secondary | ICD-10-CM

## 2013-09-11 DIAGNOSIS — M79609 Pain in unspecified limb: Secondary | ICD-10-CM

## 2013-09-11 DIAGNOSIS — M79606 Pain in leg, unspecified: Secondary | ICD-10-CM

## 2013-09-11 NOTE — Patient Instructions (Signed)
Seen for hypertrophic nails. All nails debrided. Return in 3 months or as needed.  

## 2013-09-11 NOTE — Progress Notes (Signed)
Subjective:  80 y.o. year old male patient presents complaining of painful nails. Patient requests toe nails trimmed.   Objective: Dermatologic: Thick yellow deformed nails x 10.  Vascular: Pedal pulses are not palpable on both DP and PT bilateral.  Orthopedic: High arched cavus foot with contracted lesser digits x 10.  Neurologic: All epicritic and tactile sensations grossly intact.   Assessment:  Dystrophic mycotic nails x 10.  Painful feet.  PVD.    Treatment: All mycotic nails debrided.  Return in 3 months or as needed

## 2013-09-12 ENCOUNTER — Ambulatory Visit: Payer: Medicare Other | Admitting: Podiatry

## 2013-09-22 ENCOUNTER — Encounter: Payer: Self-pay | Admitting: Cardiovascular Disease

## 2013-09-22 ENCOUNTER — Ambulatory Visit (INDEPENDENT_AMBULATORY_CARE_PROVIDER_SITE_OTHER): Payer: Medicare Other | Admitting: Cardiovascular Disease

## 2013-09-22 VITALS — BP 141/61 | HR 63 | Ht 69.0 in | Wt 172.4 lb

## 2013-09-22 DIAGNOSIS — I359 Nonrheumatic aortic valve disorder, unspecified: Secondary | ICD-10-CM

## 2013-09-22 DIAGNOSIS — I658 Occlusion and stenosis of other precerebral arteries: Secondary | ICD-10-CM

## 2013-09-22 DIAGNOSIS — I2510 Atherosclerotic heart disease of native coronary artery without angina pectoris: Secondary | ICD-10-CM

## 2013-09-22 DIAGNOSIS — I6529 Occlusion and stenosis of unspecified carotid artery: Secondary | ICD-10-CM

## 2013-09-22 DIAGNOSIS — I6523 Occlusion and stenosis of bilateral carotid arteries: Secondary | ICD-10-CM

## 2013-09-22 DIAGNOSIS — I350 Nonrheumatic aortic (valve) stenosis: Secondary | ICD-10-CM

## 2013-09-22 DIAGNOSIS — K219 Gastro-esophageal reflux disease without esophagitis: Secondary | ICD-10-CM

## 2013-09-22 MED ORDER — PANTOPRAZOLE SODIUM 40 MG PO TBEC: 40.0000 mg | DELAYED_RELEASE_TABLET | Freq: Every day | ORAL | Status: DC

## 2013-09-22 NOTE — Progress Notes (Signed)
Patient ID: Austin Ward, male   DOB: 1923/12/27, 80 y.o.   MRN: [id number]      Reason for office visit CAD, aortic stenosis, carotid stenosis, hypertension, hyperlipidemia  He was recently hospitalized with epigastric discomfort that responded to treatment with a proton pump inhibitor. He did well as long as she was taking Protonix 40 mg daily. When his prescription ran out he switched to over-the-counter omeprazole 20 mg daily and his symptoms have recurred. He does not have exertional chest pain.  It has been roughly 20 years since he underwent bypass surgery. He has no cardiovascular complaints. He specifically denies angina and dyspnea. Sometimes toward the end of a long day he may have some mild ankle swelling. He has not required repeat catheterization or any revascularization procedure since his initial bypass surgery. He had a normal nuclear stress test in 2011. He has normal left ventricular systolic function. An echo performed in March of this year showed mild to moderate aortic valve stenosis with a valve area of 1.5 cm square and a mean gradient of 18 mm Hg. He has bilateral carotid disease, more severe on the right where it is 50-70% in severity, stable however by serial studies. He takes once weekly Crestor which does a suboptimal job of controlling his LDL cholesterol but he has been intolerant to higher doses or any other statins.  Allergies  Allergen Reactions  . Ace Inhibitors   . Crestor [Rosuvastatin] Other (See Comments)    Leg weakness  . Lipitor [Atorvastatin] Other (See Comments)    Leg weakness    Current Outpatient Prescriptions  Medication Sig Dispense Refill  . acetaminophen (TYLENOL) 500 MG tablet Take 1,000 mg by mouth every 6 (six) hours as needed.      Marland Kitchen aspirin 81 MG tablet Take 81 mg by mouth daily.      . Cholecalciferol (VITAMIN D3) 2000 UNITS TABS Take 2,000 Int'l Units by mouth daily.      Marland Kitchen diltiazem (CARTIA XT) 180 MG 24 hr capsule Take 1  capsule (180 mg total) by mouth daily.  30 capsule  6  . finasteride (PROSCAR) 5 MG tablet Take 5 mg by mouth daily.      Marland Kitchen KLOR-CON 10 10 MEQ tablet TAKE 1 TABLET BY MOUTH EVERY DAY  30 tablet  6  . metoprolol succinate (TOPROL-XL) 25 MG 24 hr tablet TAKE 1 TABLET BY MOUTH ONCE DAILY  30 tablet  6  . propylthiouracil (PTU) 50 MG tablet Take 2 tablets (100 mg total) by mouth in the morning, take 1 tablet (50 mg total) by mouth in the evening.      . rosuvastatin (CRESTOR) 10 MG tablet Take 1 tablet (10 mg total) by mouth every 7 (seven) days.  21 tablet  0  . sod citrate-citric acid (ORACIT) 490-640 MG/5ML SOLN Take 15 mLs by mouth 2 (two) times daily after a meal. Take 3 tablespoons in the morning and 2 tablespoons at night      . tamsulosin (FLOMAX) 0.4 MG CAPS Take 0.8 mg by mouth daily. At bedtime      . triamterene-hydrochlorothiazide (MAXZIDE-25) 37.5-25 MG per tablet TAKE 1 TABLET BY MOUTH ONCE DAILY  30 tablet  6   No current facility-administered medications for this visit.    Past Medical History  Diagnosis Date  . CAD (coronary artery disease)   . S/P CABG x 4 02/27/1994    LIMA to LAD,SVG to OM,SVG to 2nd OM,SVG to RCA-Dr. Cyndia Bent  . Heart  murmur   . Carotid arterial disease     07/04/12 doppler:right ICA 50-69%,left bulb & ICA 0-49%  . Systemic hypertension   . Dyslipidemia   . Bilateral carotid artery stenosis 04/16/2013  . Aortic valve stenosis 04/16/2013    1.5 cm by echo March 2014  . Essential hypertension 04/16/2013    Did not tolerate higher doses of statins. Satisfactory control on once weekly Crestor     Past Surgical History  Procedure Laterality Date  . Coronary artery bypass graft  02/27/1994    LIMA to LAD,SVG to OM,SVG to 2nd OM,SVG to RCA  . Intraocular lens insertion  2000  . Cardiac catheterization  02/25/1994  . US echocardiography  07/04/2012    mild to mod AOV stenosis,mitral annular ca+,LA mildly to mod. dilated  . Nm myoview ltd  04/01/2010    no  ischemia, EF 60%    No family history on file.  History   Social History  . Marital Status: Single    Spouse Name: N/A    Number of Children: N/A  . Years of Education: N/A   Occupational History  . Not on file.   Social History Main Topics  . Smoking status: Former Smoker    Quit date: 05/03/1996  . Smokeless tobacco: Never Used  . Alcohol Use: No  . Drug Use: Not on file  . Sexual Activity: Not on file   Other Topics Concern  . Not on file   Social History Narrative  . No narrative on file    Review of systems: The patient specifically denies any chest pain at rest or with exertion, dyspnea at rest or with exertion, orthopnea, paroxysmal nocturnal dyspnea, syncope, palpitations, focal neurological deficits, intermittent claudication, lower extremity edema, unexplained weight gain, cough, hemoptysis or wheezing.  The patient also denies nausea, vomiting, dysphagia, diarrhea, constipation, polyuria, polydipsia, dysuria, hematuria, frequency, urgency, abnormal bleeding or bruising, fever, chills, unexpected weight changes, mood swings, change in skin or hair texture, change in voice quality, auditory or visual problems, allergic reactions or rashes, new musculoskeletal complaints other than usual "aches and pains".   PHYSICAL EXAM BP 141/61  Pulse 63  Ht 5\' 9"  (1.753 m)  Wt 172 lb 6.4 oz (78.2 kg)  BMI 25.45 kg/m2 General: Alert, oriented x3, no distress  Head: no evidence of trauma, PERRL, EOMI, no exophtalmos or lid lag, no myxedema, no xanthelasma; normal ears, nose and oropharynx  Neck: normal jugular venous pulsations and no hepatojugular reflux; brisk carotid pulses without delay and loud bilateral carotid bruits  Chest: clear to auscultation, no signs of consolidation by percussion or palpation, normal fremitus, symmetrical and full respiratory excursions, sternotomy scar  Cardiovascular: normal position and quality of the apical impulse, regular rhythm, normal  first and widely split second heart sounds, no rubs or gallops, early peaking 2/6 systolic ejection murmur in the aortic focus radiating towards the neck  Abdomen: no tenderness or distention, no masses by palpation, no abnormal pulsatility or arterial bruits, normal bowel sounds, no hepatosplenomegaly  Extremities: no clubbing, cyanosis or edema; 2+ radial, ulnar and brachial pulses bilaterally; 2+ right femoral, posterior tibial and dorsalis pedis pulses; 2+ left femoral, posterior tibial and dorsalis pedis pulses; no subclavian or femoral bruits  Neurological: grossly nonfocal  Lipid Panel     Component Value Date/Time   CHOL 131 04/19/2013 1027   TRIG 119 04/19/2013 1027   HDL 32* 04/19/2013 1027   CHOLHDL 4.1 04/19/2013 1027   VLDL 24 04/19/2013 1027  [place] 75 04/19/2013 1027    BMET    Component Value Date/Time   NA 141 07/21/2013 1739   K 3.9 07/21/2013 1739   CL 101 07/21/2013 1739   CO2 26 07/21/2013 1739   GLUCOSE 114* 07/21/2013 1739   BUN 29* 07/21/2013 1739   CREATININE 1.19 07/21/2013 1739   CREATININE 1.14 04/19/2013 1027   CALCIUM 9.5 07/21/2013 1739   GFRNONAA 53* 07/21/2013 1739   GFRAA 61* 07/21/2013 1739     ASSESSMENT AND PLAN Gastroesophageal reflux disease His epigastric discomfort responded well to pantoprazole 40 mg daily, but his symptoms have recurred when he switched to over-the-counter omeprazole. Will refill his pantoprazole prescription long-term. Aortic valve stenosis  Asymptomatic mild to moderate aortic stenosis, repeat echo if he develops symptoms of chest pain with exertion, dyspnea on exertion or dizziness/syncope with exertion. I think because of his age and history of previous sternotomy he would not be a good candidate for aortic valve replacement with conventional surgery, but he may benefit from TAVR for symptomatic aortic stenosis in the future. Bilateral carotid artery stenosis  Asymptomatic. Reevaluate in one year.  CAD s/p CABG 1995    Bypass surgery provided a remarkably durable benefit. He is asymptomatic. He has normal left ventricular systolic function.  Essential hypertension  Good control. Note that he is taking both a beta blocker and a centrally acting calcium channel blocker and has right bundle branch block. Low threshold to discontinue the diltiazem should he develop symptoms of dizziness or syncope.  Dyslipidemia  Despite a very low dose of statin) Crestor 10 mg weekly) his lipid profile is actually pretty good).  No orders of the defined types were placed in this encounter.   Meds ordered this encounter  Medications  . acetaminophen (TYLENOL) 500 MG tablet    Sig: Take 1,000 mg by mouth every 6 (six) hours as needed.    Farida Mcreynolds  Sanda Klein, MD, [facility] [phone] office [phone] pager

## 2013-09-22 NOTE — Patient Instructions (Signed)
Stop Omeprazole.  Start Pantoprazole 40mg  daily.  Dr. Sallyanne Kuster recommends that you schedule a follow-up appointment in: One year.

## 2013-10-24 ENCOUNTER — Other Ambulatory Visit: Payer: Self-pay | Admitting: Otolaryngology

## 2013-10-25 ENCOUNTER — Other Ambulatory Visit: Payer: Self-pay | Admitting: Family Medicine

## 2013-11-06 ENCOUNTER — Other Ambulatory Visit (HOSPITAL_COMMUNITY): Payer: Self-pay | Admitting: Otolaryngology

## 2013-11-06 DIAGNOSIS — C069 Malignant neoplasm of mouth, unspecified: Secondary | ICD-10-CM

## 2013-11-14 ENCOUNTER — Ambulatory Visit (HOSPITAL_COMMUNITY): Payer: Medicare Other

## 2013-11-17 ENCOUNTER — Other Ambulatory Visit: Payer: Self-pay | Admitting: Otolaryngology

## 2013-11-17 ENCOUNTER — Ambulatory Visit (HOSPITAL_COMMUNITY)
Admission: RE | Admit: 2013-11-17 | Discharge: 2013-11-17 | Disposition: A | Discharge status: Home / Self Care | Payer: Medicare Other | Source: Ambulatory Visit | Attending: Otolaryngology | Admitting: Otolaryngology

## 2013-11-17 ENCOUNTER — Other Ambulatory Visit (HOSPITAL_COMMUNITY)
Admission: RE | Admit: 2013-11-17 | Discharge: 2013-11-17 | Disposition: A | Discharge status: Home / Self Care | Payer: Medicare Other | Source: Ambulatory Visit | Attending: Otolaryngology | Admitting: Otolaryngology

## 2013-11-17 DIAGNOSIS — C760 Malignant neoplasm of head, face and neck: Secondary | ICD-10-CM | POA: Insufficient documentation

## 2013-11-17 DIAGNOSIS — R911 Solitary pulmonary nodule: Secondary | ICD-10-CM | POA: Diagnosis not present

## 2013-11-17 DIAGNOSIS — E049 Nontoxic goiter, unspecified: Secondary | ICD-10-CM | POA: Insufficient documentation

## 2013-11-17 DIAGNOSIS — K118 Other diseases of salivary glands: Secondary | ICD-10-CM | POA: Diagnosis not present

## 2013-11-17 DIAGNOSIS — C069 Malignant neoplasm of mouth, unspecified: Secondary | ICD-10-CM

## 2013-11-17 MED ORDER — FLUDEOXYGLUCOSE F - 18 (FDG) INJECTION
10.1000 | Freq: Once | INTRAVENOUS | Status: AC | PRN
  Administered 2013-11-17: 10.1 via INTRAVENOUS

## 2013-11-20 LAB — GLUCOSE, CAPILLARY: GLUCOSE-CAPILLARY: 93 mg/dL (ref 70–99)

## 2013-11-30 ENCOUNTER — Encounter (HOSPITAL_COMMUNITY): Payer: Self-pay | Admitting: Pharmacy Technician

## 2013-12-04 NOTE — H&P (Signed)
Assessment  Presbycusis of both ears (388.01) (H91.13). Bilateral impacted cerumen (380.4) (H61.23). History of tongue cancer (V10.01) (Z85.810). 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 Cervical lymphadenopathy (785.6) (R59.0). Discussed  Floor of mouth carcinoma in situ, suspicious right submandibular lymph node. FNA performed today. After the results are returned, we will discuss different options. Radiation is not a great option for oral cavity cancer and he is not very excited about the prospect of radiation treatments. We did discuss the possible need for revision floor of mouth resection and neck dissection. PET scan also pending. Reason For Visit  Cancer. HPI  Here for second opinion. Floor of mouth lesion biopsied recently. Found to have carcinoma in situ. He also has a palpable lymph node in the right submandibular area. He had a PET scan earlier today. He has a history of tongue cancer many years ago. He is not sure how interested he is in pursuing any further treatment. Allergies  No Known Drug Allergies. Current Meds  Propylthiouracil 50 MG Oral Tablet;; RPT Metoprolol Tartrate 50 MG Oral Tablet;; RPT Triamterene-HCTZ 37.5-25 MG Oral Tablet;; RPT Aspirin Low Dose 81 MG Oral Tablet;; RPT Cartia XT 180 MG/24HR CPCR;; RPT Protonix 40 MG Oral Tablet Delayed Release (Pantoprazole Sodium);; RPT Acetaminophen TABS;; RPT Oracit SOLN;; RPT Klor-Con TBCR;; RPT Crestor TABS;; RPT Finasteride TABS;; RPT Vitamin D3 TABS;; RPT Tamsulosin HCl CAPS;; RPT. Active Problems  Cancer   (199.1) (C80.1) Hearing loss   (389.9) (H91.90) Heartburn   (787.1) (R12). Family Hx  Family history of cardiac disorder: Mother (V17.49) (Z82.49). Personal Hx  Never smoker No alcohol use No caffeine use Non-smoker (V49.89) (Z78.9). ROS  12 system ROS was obtained and reviewed on the Health Maintenance form dated today.  Positive responses are shown above.  If the symptom is not checked, the  patient has denied it. Vital Signs   Recorded by Skolimowski,Sharon on 17 Nov 2013 04:05 PM BP:108/60,  Height: 5 ft 9 in, Weight: 169 lb , BMI: 25 kg/m2,  BMI Calculated: 24.96 ,  BSA Calculated: 1.92. Physical Exam  APPEARANCE: Healthy appearing elderly gentleman, in no acute distress.  Normal affect, in a pleasant mood.  Oriented to time, place and person. COMMUNICATION: Normal voice   HEAD & FACE:  No scars, lesions or masses of head and face.  Sinuses nontender to palpation.  Salivary glands without mass or tenderness.  Facial strength symmetric.  No facial lesion, scars, or mass. EYES: EOMI with normal primary gaze alignment. Visual acuity grossly intact.  PERRLA EXTERNAL EAR & NOSE: No scars, lesions or masses  EAC & TYMPANIC MEMBRANE: Bilateral cerumen impaction cleaned out under the microscope. The tympanic membranes are normal bilaterally with good movement to insufflation. GROSS HEARING: Grossly diminished TMJ:  Nontender  INTRANASAL EXAM: No polyps or purulence.  NASOPHARYNX: Normal, without lesions. LIPS, TEETH & GUMS: No lip lesions, extensive dental restoration and normal gums. ORAL CAVITY/OROPHARYNX:  Oral mucosa moist without lesion or asymmetry of the palate, tongue, tonsil or posterior pharynx. Palpable firmness in the floor of mouth anteriorly. NECK:  Supple without adenopathy or mass, except for a small right anterior submandibular node. THYROID:  Normal with no masses palpable.  NEUROLOGIC:  No gross CN deficits. No nystagmus noted.   LYMPHATIC:  No other enlarged nodes palpable. Procedure  CERUMEN REMOVAL The risks and benefits of this procedure have been thoroughly discussed with the patient/parent.  The most commons risks outlined included but were not limited to: injury of the ear canal or  tympanic membrane.  The patient/parent was further informed that there are other less common risks.  The patient/parent  was given the opportunity to ask questions and all such  questions were answered to the patient/parent's satisfaction.  Patient/parent  acknowledged the risks and has agreed to proceed.   Procedure: Patient was laid supine and using the microscope cerumen was removed from Both external auditory canal(s) using suction and various instrumentation including wax currettes and suctions. Tolerance: Excellent Unplanned interventions: None  Unplanned events: No complications. FNA The risks and benefits of this procedure have been thoroughly discussed with the patient.  The most commons risks outlined included but were not limited to: injury  to the nasal mucosa or throat irritation.  The patient was further informed that there are other less common risks.  The patient was given the opportunity to ask questions and all such questions were answered to the patient's satisfaction.  Patient acknowledged the risks and has agreed to proceed.   Preop Diagnosis:  Procedure: Using a 10 cc syringe with a 22 gauge needle, the mass aspirated. This was repeated with a separate needle a second time. Cytology samples were prepared. A dressing was applied. Tolerance: Excellent Unplanned interventions: None  Unplanned events: No complications. Signature  Electronically signed by : Izora Gala  M.D.; 11/17/2013 5:28 PM EST.

## 2013-12-05 ENCOUNTER — Telehealth: Payer: Self-pay | Admitting: Cardiovascular Disease

## 2013-12-05 ENCOUNTER — Other Ambulatory Visit: Payer: Self-pay | Admitting: *Deleted

## 2013-12-05 MED ORDER — ROSUVASTATIN CALCIUM 10 MG PO TABS: 10.0000 mg | ORAL_TABLET | Freq: Every day | ORAL | Status: DC

## 2013-12-05 MED ORDER — ROSUVASTATIN CALCIUM 10 MG PO TABS: 10.0000 mg | ORAL_TABLET | ORAL | Status: DC

## 2013-12-05 NOTE — Telephone Encounter (Signed)
Patient called requesting samples of crestor 10 mg. 3 weeks provided and left at the front desk for patient to pick up. Message left on patient's machine.

## 2013-12-05 NOTE — Telephone Encounter (Signed)
New Prob    Requesting samples of Crestor 10 mg. Please call.

## 2013-12-05 NOTE — Telephone Encounter (Signed)
Med refilled.

## 2013-12-06 ENCOUNTER — Other Ambulatory Visit: Payer: Self-pay | Admitting: Cardiovascular Disease

## 2013-12-06 NOTE — Telephone Encounter (Signed)
Rx was sent to pharmacy electronically. 

## 2013-12-07 NOTE — Pre-Procedure Instructions (Signed)
Thomson Herbers  12/07/2013   Your procedure is scheduled on:  12/13/13  Report to [facility] Admitting at 1050 AM.  Call this number if you have problems the morning of surgery: [phone]   Remember:   Do not eat food or drink liquids after midnight.   Take these medicines the morning of surgery with A SIP OF WATER: tylenol,cartia,proscar,metoprolol,protonix,flomax,ptu   Do not wear jewelry, make-up or nail polish.  Do not wear lotions, powders, or perfumes. You may wear deodorant.  Do not shave 48 hours prior to surgery. Men may shave face and neck.  Do not bring valuables to the hospital.  [facility] is not responsible                  for any belongings or valuables.               Contacts, dentures or bridgework may not be worn into surgery.  Leave suitcase in the car. After surgery it may be brought to your room.  For patients admitted to the hospital, discharge time is determined by your                treatment team.               Patients discharged the day of surgery will not be allowed to drive  home.  Name and phone number of your driver: family  Special Instructions: Incentive Spirometry - Practice and bring it with you on the day of surgery.   Please read over the following fact sheets that you were given: Pain Booklet, Coughing and Deep Breathing and Surgical Site Infection Prevention

## 2013-12-08 ENCOUNTER — Encounter (HOSPITAL_COMMUNITY): Payer: Self-pay

## 2013-12-08 ENCOUNTER — Encounter (HOSPITAL_COMMUNITY)
Admission: RE | Admit: 2013-12-08 | Discharge: 2013-12-08 | Disposition: A | Discharge status: Home / Self Care | Payer: Medicare Other | Source: Ambulatory Visit | Attending: Otolaryngology | Admitting: Otolaryngology

## 2013-12-08 DIAGNOSIS — Z951 Presence of aortocoronary bypass graft: Secondary | ICD-10-CM | POA: Insufficient documentation

## 2013-12-08 DIAGNOSIS — Z01812 Encounter for preprocedural laboratory examination: Secondary | ICD-10-CM | POA: Insufficient documentation

## 2013-12-08 DIAGNOSIS — Z01818 Encounter for other preprocedural examination: Secondary | ICD-10-CM | POA: Insufficient documentation

## 2013-12-08 HISTORY — DX: Tremor, unspecified: R25.1

## 2013-12-08 HISTORY — DX: Gastro-esophageal reflux disease without esophagitis: K21.9

## 2013-12-08 HISTORY — DX: Personal history of other infectious and parasitic diseases: Z86.19

## 2013-12-08 HISTORY — DX: Malignant (primary) neoplasm, unspecified: C80.1

## 2013-12-08 HISTORY — DX: Unspecified hearing loss, unspecified ear: H91.90

## 2013-12-08 HISTORY — DX: Benign prostatic hyperplasia without lower urinary tract symptoms: N40.0

## 2013-12-08 HISTORY — DX: Thyrotoxicosis, unspecified without thyrotoxic crisis or storm: E05.90

## 2013-12-08 LAB — BASIC METABOLIC PANEL
Anion gap: 13 (ref 5–15)
BUN: 26 mg/dL — ABNORMAL HIGH (ref 6–23)
CHLORIDE: 103 meq/L (ref 96–112)
CO2: 27 mEq/L (ref 19–32)
CREATININE: 1.23 mg/dL (ref 0.50–1.35)
Calcium: 9.3 mg/dL (ref 8.4–10.5)
GFR calc Af Amer: 59 mL/min — ABNORMAL LOW (ref >90)
GFR, EST NON AFRICAN AMERICAN: 51 mL/min — AB (ref >90)
Glucose, Bld: 111 mg/dL — ABNORMAL HIGH (ref 70–99)
Potassium: 4.4 mEq/L (ref 3.7–5.3)
Sodium: 143 mEq/L (ref 137–147)

## 2013-12-08 LAB — CBC
HEMATOCRIT: 41.2 % (ref 39.0–52.0)
Hemoglobin: 13.6 g/dL (ref 13.0–17.0)
MCH: 29.9 pg (ref 26.0–34.0)
MCHC: 33 g/dL (ref 30.0–36.0)
MCV: 90.5 fL (ref 78.0–100.0)
Platelets: 211 10*3/uL (ref 150–400)
RBC: 4.55 MIL/uL (ref 4.22–5.81)
RDW: 14 % (ref 11.5–15.5)
WBC: 8.9 10*3/uL (ref 4.0–10.5)

## 2013-12-08 NOTE — Progress Notes (Signed)
Pt had CABG done in 1995, states that he's not had any chest pain or cardiac problems since. Dr. Orene Desanctis is his cardiologist.  Pt is very hard of hearing.

## 2013-12-08 NOTE — Pre-Procedure Instructions (Signed)
Austin Ward  12/08/2013   Your procedure is scheduled on:  Wednesday, December 13, 2013 at 12:50 PM.   Report to [facility] Entrance "A" Admitting Office at 10:50 AM.   Call this number if you have problems the morning of surgery: [phone]   Remember:   Do not eat food or drink liquids after midnight Tuesday, 12/12/13.   Take these medicines the morning of surgery with A SIP OF WATER: diltiazem (CARDIZEM CD), metoprolol succinate (TOPROL-XL), pantoprazole (PROTONIX), propylthiouracil (PTU), acetaminophen (TYLENOL) - if needed.  Stop Aspirin and Vitamins as of today.   Do not wear jewelry.  Do not wear lotions, powders, or cologne. You may wear deodorant.  Do not shave 48 hours prior to surgery. Men may shave face and neck.  Do not bring valuables to the hospital.  [facility] is not responsible                  for any belongings or valuables.               Contacts, dentures or bridgework may not be worn into surgery.  Leave suitcase in the car. After surgery it may be brought to your room.  For patients admitted to the hospital, discharge time is determined by your                treatment team.     Special Instructions: Austin Ward - Preparing for Surgery  Before surgery, you can play an important role.  Because skin is not sterile, your skin needs to be as free of germs as possible.  You can reduce the number of germs on you skin by washing with CHG (chlorahexidine gluconate) soap before surgery.  CHG is an antiseptic cleaner which kills germs and bonds with the skin to continue killing germs even after washing.  Please DO NOT use if you have an allergy to CHG or antibacterial soaps.  If your skin becomes reddened/irritated stop using the CHG and inform your nurse when you arrive at Short Stay.  Do not shave (including legs and underarms) for at least 48 hours prior to the first CHG shower.  You may shave your face.  Please follow these instructions  carefully:   1.  Shower with CHG Soap the night before surgery and the                                morning of Surgery.  2.  If you choose to wash your hair, wash your hair first as usual with your       normal shampoo.  3.  After you shampoo, rinse your hair and body thoroughly to remove the                      Shampoo.  4.  Use CHG as you would any other liquid soap.  You can apply chg directly       to the skin and wash gently with scrungie or a clean washcloth.  5.  Apply the CHG Soap to your body ONLY FROM THE NECK DOWN.        Do not use on open wounds or open sores.  Avoid contact with your eyes, ears, mouth and genitals (private parts).  Wash genitals (private parts) with your normal soap.  6.  Wash thoroughly, paying special attention to the area where your surgery  will be performed.  7.  Thoroughly rinse your body with warm water from the neck down.  8.  DO NOT shower/wash with your normal soap after using and rinsing off       the CHG Soap.  9.  Pat yourself dry with a clean towel.            10.  Wear clean pajamas.            11.  Place clean sheets on your bed the night of your first shower and do not        sleep with pets.  Day of Surgery  Do not apply any lotions the morning of surgery.  Please wear clean clothes to the hospital/surgery center.     Please read over the following fact sheets that you were given: Pain Booklet, Coughing and Deep Breathing and Surgical Site Infection Prevention

## 2013-12-12 ENCOUNTER — Encounter: Payer: Self-pay | Admitting: Podiatry

## 2013-12-12 ENCOUNTER — Ambulatory Visit (INDEPENDENT_AMBULATORY_CARE_PROVIDER_SITE_OTHER): Payer: Medicare Other | Admitting: Podiatry

## 2013-12-12 VITALS — BP 134/50 | HR 62

## 2013-12-12 DIAGNOSIS — M79606 Pain in leg, unspecified: Secondary | ICD-10-CM

## 2013-12-12 DIAGNOSIS — B351 Tinea unguium: Secondary | ICD-10-CM

## 2013-12-12 DIAGNOSIS — M79609 Pain in unspecified limb: Secondary | ICD-10-CM

## 2013-12-12 NOTE — Progress Notes (Signed)
Subjective:  80 y.o. year old male patient presents complaining of painful nails. Patient requests toe nails trimmed.  Denies any new problems.   Objective: Dermatologic: Thick yellow deformed nails x 10.  Vascular: Pedal pulses are not palpable on both DP and PT bilateral.  No edema or erythema noted.  Orthopedic: High arched cavus foot with contracted lesser digits x 10.  Neurologic: All epicritic and tactile sensations grossly intact.   Assessment:  Dystrophic mycotic nails x 10.  Painful feet.  PVD.   Treatment: All mycotic nails debrided.  Return in 3 months or as needed

## 2013-12-12 NOTE — Patient Instructions (Signed)
Seen for hypertrophic nails. All nails debrided. Return in 3 months or as needed.  

## 2013-12-13 ENCOUNTER — Encounter (HOSPITAL_COMMUNITY): Payer: Medicare Other | Admitting: Anesthesiology

## 2013-12-13 ENCOUNTER — Encounter (HOSPITAL_COMMUNITY): Payer: Self-pay | Admitting: *Deleted

## 2013-12-13 ENCOUNTER — Ambulatory Visit (HOSPITAL_COMMUNITY): Payer: Medicare Other | Admitting: Anesthesiology

## 2013-12-13 ENCOUNTER — Observation Stay (HOSPITAL_COMMUNITY)
Admission: RE | Admit: 2013-12-13 | Discharge: 2013-12-14 | Disposition: A | Discharge status: Home / Self Care | Payer: Medicare Other | Source: Ambulatory Visit | Attending: Otolaryngology | Admitting: Otolaryngology

## 2013-12-13 ENCOUNTER — Encounter (HOSPITAL_COMMUNITY)
Admission: RE | Disposition: A | Discharge status: Home / Self Care | Payer: Self-pay | Source: Ambulatory Visit | Attending: Otolaryngology

## 2013-12-13 DIAGNOSIS — H9110 Presbycusis, unspecified ear: Secondary | ICD-10-CM | POA: Insufficient documentation

## 2013-12-13 DIAGNOSIS — Z7982 Long term (current) use of aspirin: Secondary | ICD-10-CM | POA: Diagnosis not present

## 2013-12-13 DIAGNOSIS — Z79899 Other long term (current) drug therapy: Secondary | ICD-10-CM | POA: Insufficient documentation

## 2013-12-13 DIAGNOSIS — C049 Malignant neoplasm of floor of mouth, unspecified: Secondary | ICD-10-CM | POA: Diagnosis present

## 2013-12-13 DIAGNOSIS — R599 Enlarged lymph nodes, unspecified: Secondary | ICD-10-CM | POA: Diagnosis not present

## 2013-12-13 DIAGNOSIS — H6120 Impacted cerumen, unspecified ear: Secondary | ICD-10-CM | POA: Insufficient documentation

## 2013-12-13 HISTORY — PX: MOUTH BIOPSY: SHX1012

## 2013-12-13 HISTORY — PX: FLOOR OF MOUTH BIOPSY: SHX5834

## 2013-12-13 SURGERY — BIOPSY, MOUTH, FLOOR
Anesthesia: General | Site: Mouth

## 2013-12-13 MED ORDER — FENTANYL CITRATE 0.05 MG/ML IJ SOLN
INTRAMUSCULAR | Status: AC
  Filled 2013-12-13: qty 5

## 2013-12-13 MED ORDER — METOPROLOL SUCCINATE ER 25 MG PO TB24
25.0000 mg | ORAL_TABLET | Freq: Every day | ORAL | Status: DC
  Administered 2013-12-14: 25 mg via ORAL
  Filled 2013-12-13: qty 1

## 2013-12-13 MED ORDER — TRIAMTERENE-HCTZ 37.5-25 MG PO TABS
1.0000 | ORAL_TABLET | Freq: Every day | ORAL | Status: DC
  Administered 2013-12-13 – 2013-12-14 (×2): 1 via ORAL
  Filled 2013-12-13 (×2): qty 1

## 2013-12-13 MED ORDER — PROPYLTHIOURACIL 50 MG PO TABS
50.0000 mg | ORAL_TABLET | Freq: Every evening | ORAL | Status: DC
  Administered 2013-12-13: 50 mg via ORAL
  Filled 2013-12-13 (×2): qty 1

## 2013-12-13 MED ORDER — NEOSTIGMINE METHYLSULFATE 10 MG/10ML IV SOLN
INTRAVENOUS | Status: AC
  Filled 2013-12-13: qty 1

## 2013-12-13 MED ORDER — CEFAZOLIN SODIUM-DEXTROSE 2-3 GM-% IV SOLR
2.0000 g | INTRAVENOUS | Status: AC
  Administered 2013-12-13: 2 g via INTRAVENOUS
  Filled 2013-12-13: qty 50

## 2013-12-13 MED ORDER — TAMSULOSIN HCL 0.4 MG PO CAPS
0.8000 mg | ORAL_CAPSULE | Freq: Every day | ORAL | Status: DC
  Administered 2013-12-13: 0.8 mg via ORAL
  Filled 2013-12-13 (×2): qty 2

## 2013-12-13 MED ORDER — PANTOPRAZOLE SODIUM 40 MG PO TBEC
40.0000 mg | DELAYED_RELEASE_TABLET | Freq: Every day | ORAL | Status: DC
  Administered 2013-12-14: 40 mg via ORAL
  Filled 2013-12-13: qty 1

## 2013-12-13 MED ORDER — LIDOCAINE HCL (CARDIAC) 20 MG/ML IV SOLN
INTRAVENOUS | Status: AC
  Filled 2013-12-13: qty 5

## 2013-12-13 MED ORDER — ACETAMINOPHEN 500 MG PO TABS: 1000.0000 mg | ORAL_TABLET | Freq: Four times a day (QID) | ORAL | Status: DC | PRN

## 2013-12-13 MED ORDER — LIDOCAINE-EPINEPHRINE 1 %-1:100000 IJ SOLN
INTRAMUSCULAR | Status: AC
  Filled 2013-12-13: qty 1

## 2013-12-13 MED ORDER — NEOSTIGMINE METHYLSULFATE 10 MG/10ML IV SOLN
INTRAVENOUS | Status: DC | PRN
  Administered 2013-12-13: 3 mg via INTRAVENOUS

## 2013-12-13 MED ORDER — CLINDAMYCIN PALMITATE HCL 75 MG/5ML PO SOLR
300.0000 mg | Freq: Three times a day (TID) | ORAL | Status: DC
  Administered 2013-12-13 (×2): 300 mg via ORAL
  Filled 2013-12-13 (×6): qty 20

## 2013-12-13 MED ORDER — PROPOFOL 10 MG/ML IV BOLUS
INTRAVENOUS | Status: AC
  Filled 2013-12-13: qty 20

## 2013-12-13 MED ORDER — GLYCOPYRROLATE 0.2 MG/ML IJ SOLN
INTRAMUSCULAR | Status: DC | PRN
  Administered 2013-12-13: 0.4 mg via INTRAVENOUS

## 2013-12-13 MED ORDER — POTASSIUM CHLORIDE CRYS ER 10 MEQ PO TBCR
10.0000 meq | EXTENDED_RELEASE_TABLET | Freq: Every day | ORAL | Status: DC
  Administered 2013-12-13 – 2013-12-14 (×2): 10 meq via ORAL
  Filled 2013-12-13 (×2): qty 1

## 2013-12-13 MED ORDER — CITRIC ACID-SODIUM CITRATE 334-500 MG/5ML PO SOLN
45.0000 mL | Freq: Every day | ORAL | Status: DC
  Filled 2013-12-13 (×2): qty 45

## 2013-12-13 MED ORDER — ATORVASTATIN CALCIUM 10 MG PO TABS: 10.0000 mg | ORAL_TABLET | Freq: Every day | ORAL | Status: DC

## 2013-12-13 MED ORDER — ROSUVASTATIN CALCIUM 10 MG PO TABS: 10.0000 mg | ORAL_TABLET | ORAL | Status: DC

## 2013-12-13 MED ORDER — EPHEDRINE SULFATE 50 MG/ML IJ SOLN
INTRAMUSCULAR | Status: DC | PRN
  Administered 2013-12-13: 5 mg via INTRAVENOUS
  Administered 2013-12-13 (×2): 2.5 mg via INTRAVENOUS
  Administered 2013-12-13: 5 mg via INTRAVENOUS

## 2013-12-13 MED ORDER — ONDANSETRON HCL 4 MG/2ML IJ SOLN
INTRAMUSCULAR | Status: AC
  Filled 2013-12-13: qty 2

## 2013-12-13 MED ORDER — PROPYLTHIOURACIL 50 MG PO TABS: 50.0000 mg | ORAL_TABLET | Freq: Two times a day (BID) | ORAL | Status: DC

## 2013-12-13 MED ORDER — CLINDAMYCIN HCL 300 MG PO CAPS: 300.0000 mg | ORAL_CAPSULE | Freq: Three times a day (TID) | ORAL | Status: DC

## 2013-12-13 MED ORDER — HYDROCODONE-ACETAMINOPHEN 7.5-325 MG PO TABS: 1.0000 | ORAL_TABLET | Freq: Four times a day (QID) | ORAL | Status: DC | PRN

## 2013-12-13 MED ORDER — PROPYLTHIOURACIL 50 MG PO TABS
100.0000 mg | ORAL_TABLET | Freq: Every morning | ORAL | Status: DC
  Administered 2013-12-14: 100 mg via ORAL
  Filled 2013-12-13: qty 2

## 2013-12-13 MED ORDER — ASPIRIN EC 81 MG PO TBEC
81.0000 mg | DELAYED_RELEASE_TABLET | Freq: Every day | ORAL | Status: DC
  Administered 2013-12-13 – 2013-12-14 (×2): 81 mg via ORAL
  Filled 2013-12-13 (×2): qty 1

## 2013-12-13 MED ORDER — GLYCOPYRROLATE 0.2 MG/ML IJ SOLN
INTRAMUSCULAR | Status: AC
  Filled 2013-12-13: qty 2

## 2013-12-13 MED ORDER — PROMETHAZINE HCL 25 MG PO TABS: 25.0000 mg | ORAL_TABLET | Freq: Four times a day (QID) | ORAL | Status: DC | PRN

## 2013-12-13 MED ORDER — ONDANSETRON HCL 4 MG/2ML IJ SOLN
INTRAMUSCULAR | Status: DC | PRN
  Administered 2013-12-13: 4 mg via INTRAVENOUS

## 2013-12-13 MED ORDER — 0.9 % SODIUM CHLORIDE (POUR BTL) OPTIME
TOPICAL | Status: DC | PRN
  Administered 2013-12-13: 1000 mL

## 2013-12-13 MED ORDER — PROMETHAZINE HCL 25 MG RE SUPP: 25.0000 mg | Freq: Four times a day (QID) | RECTAL | Status: DC | PRN

## 2013-12-13 MED ORDER — LACTATED RINGERS IV SOLN
INTRAVENOUS | Status: DC | PRN
  Administered 2013-12-13: 12:00:00 via INTRAVENOUS

## 2013-12-13 MED ORDER — FINASTERIDE 5 MG PO TABS
5.0000 mg | ORAL_TABLET | Freq: Every day | ORAL | Status: DC
  Administered 2013-12-13 – 2013-12-14 (×2): 5 mg via ORAL
  Filled 2013-12-13 (×2): qty 1

## 2013-12-13 MED ORDER — SOD CITRATE-CITRIC ACID 490-640 MG/5ML PO SOLN: 15.0000 mL | Freq: Two times a day (BID) | ORAL | Status: DC

## 2013-12-13 MED ORDER — ROCURONIUM BROMIDE 100 MG/10ML IV SOLN
INTRAVENOUS | Status: DC | PRN
  Administered 2013-12-13: 50 mg via INTRAVENOUS

## 2013-12-13 MED ORDER — ROCURONIUM BROMIDE 50 MG/5ML IV SOLN
INTRAVENOUS | Status: AC
  Filled 2013-12-13: qty 1

## 2013-12-13 MED ORDER — CITRIC ACID-SODIUM CITRATE 334-500 MG/5ML PO SOLN
30.0000 mL | Freq: Every day | ORAL | Status: DC
  Administered 2013-12-13: 30 mL via ORAL
  Filled 2013-12-13 (×2): qty 30

## 2013-12-13 MED ORDER — FENTANYL CITRATE 0.05 MG/ML IJ SOLN
INTRAMUSCULAR | Status: DC | PRN
  Administered 2013-12-13: 100 ug via INTRAVENOUS
  Administered 2013-12-13 (×2): 50 ug via INTRAVENOUS

## 2013-12-13 MED ORDER — VITAMIN D3 25 MCG (1000 UNIT) PO TABS
2000.0000 [IU] | ORAL_TABLET | Freq: Every day | ORAL | Status: DC
  Administered 2013-12-13 – 2013-12-14 (×2): 2000 [IU] via ORAL
  Filled 2013-12-13 (×2): qty 2

## 2013-12-13 MED ORDER — HYDROCODONE-ACETAMINOPHEN 5-325 MG PO TABS
1.0000 | ORAL_TABLET | ORAL | Status: DC | PRN
  Administered 2013-12-13: 1 via ORAL
  Filled 2013-12-13: qty 1

## 2013-12-13 MED ORDER — BACITRACIN ZINC 500 UNIT/GM EX OINT
TOPICAL_OINTMENT | CUTANEOUS | Status: AC
  Filled 2013-12-13: qty 15

## 2013-12-13 MED ORDER — LIDOCAINE-EPINEPHRINE 1 %-1:100000 IJ SOLN
INTRAMUSCULAR | Status: DC | PRN
  Administered 2013-12-13: 20 mL

## 2013-12-13 MED ORDER — PROPOFOL 10 MG/ML IV BOLUS
INTRAVENOUS | Status: DC | PRN
  Administered 2013-12-13: 120 mg via INTRAVENOUS

## 2013-12-13 MED ORDER — LIDOCAINE HCL (CARDIAC) 20 MG/ML IV SOLN
INTRAVENOUS | Status: DC | PRN
  Administered 2013-12-13: 100 mg via INTRAVENOUS

## 2013-12-13 MED ORDER — DEXTROSE-NACL 5-0.9 % IV SOLN
INTRAVENOUS | Status: DC
  Administered 2013-12-13: 17:00:00 via INTRAVENOUS

## 2013-12-13 MED ORDER — LACTATED RINGERS IV SOLN
INTRAVENOUS | Status: DC
  Administered 2013-12-13: 11:00:00 via INTRAVENOUS

## 2013-12-13 MED ORDER — DILTIAZEM HCL ER COATED BEADS 180 MG PO CP24
180.0000 mg | ORAL_CAPSULE | Freq: Every day | ORAL | Status: DC
  Administered 2013-12-14: 180 mg via ORAL
  Filled 2013-12-13: qty 1

## 2013-12-13 SURGICAL SUPPLY — 53 items
ADH SKN CLS APL DERMABOND .7 (GAUZE/BANDAGES/DRESSINGS) ×1
ATTRACTOMAT 16X20 MAGNETIC DRP (DRAPES) IMPLANT
CANISTER SUCTION 2500CC (MISCELLANEOUS) ×3 IMPLANT
CLEANER TIP ELECTROSURG 2X2 (MISCELLANEOUS) ×3 IMPLANT
CONT SPEC 4OZ CLIKSEAL STRL BL (MISCELLANEOUS) ×3 IMPLANT
CONT SPEC STER OR (MISCELLANEOUS) ×4 IMPLANT
CORDS BIPOLAR (ELECTRODE) ×5 IMPLANT
COVER SURGICAL LIGHT HANDLE (MISCELLANEOUS) ×3 IMPLANT
DERMABOND ADVANCED (GAUZE/BANDAGES/DRESSINGS) ×2
DERMABOND ADVANCED .7 DNX12 (GAUZE/BANDAGES/DRESSINGS) ×1 IMPLANT
DRAIN PENROSE 1/4X12 LTX STRL (WOUND CARE) IMPLANT
DRAIN SNY 10 ROU (WOUND CARE) IMPLANT
DRAPE SURG 17X23 STRL (DRAPES) ×3 IMPLANT
ELECT COATED BLADE 2.86 ST (ELECTRODE) ×3 IMPLANT
ELECT PAIRED SUBDERMAL (MISCELLANEOUS)
ELECT REM PT RETURN 9FT ADLT (ELECTROSURGICAL) ×3
ELECTRODE PAIRED SUBDERMAL (MISCELLANEOUS) IMPLANT
ELECTRODE REM PT RTRN 9FT ADLT (ELECTROSURGICAL) ×1 IMPLANT
EVACUATOR SILICONE 100CC (DRAIN) IMPLANT
GLOVE BIO SURGEON STRL SZ7.5 (GLOVE) ×3 IMPLANT
GLOVE BIOGEL PI IND STRL 6.5 (GLOVE) IMPLANT
GLOVE BIOGEL PI INDICATOR 6.5 (GLOVE) ×4
GLOVE SURG SS PI 6.5 STRL IVOR (GLOVE) ×4 IMPLANT
GOWN STRL REUS W/ TWL LRG LVL3 (GOWN DISPOSABLE) ×2 IMPLANT
GOWN STRL REUS W/TWL LRG LVL3 (GOWN DISPOSABLE) ×6
KIT BASIN OR (CUSTOM PROCEDURE TRAY) ×3 IMPLANT
KIT ROOM TURNOVER OR (KITS) ×3 IMPLANT
LOCATOR NERVE 3 VOLT (DISPOSABLE) IMPLANT
NS IRRIG 1000ML POUR BTL (IV SOLUTION) ×3 IMPLANT
PAD ARMBOARD 7.5X6 YLW CONV (MISCELLANEOUS) ×6 IMPLANT
PENCIL FOOT CONTROL (ELECTRODE) ×3 IMPLANT
PROBE NERVBE PRASS .33 (MISCELLANEOUS) IMPLANT
SPECIMEN JAR SMALL (MISCELLANEOUS) ×3 IMPLANT
STAPLER VISISTAT 35W (STAPLE) ×3 IMPLANT
SUT CHROMIC 3 0 PS 2 (SUTURE) ×3 IMPLANT
SUT ETHILON 2 0 FS 18 (SUTURE) ×3 IMPLANT
SUT ETHILON 3 0 PS 1 (SUTURE) IMPLANT
SUT ETHILON 5 0 P 3 18 (SUTURE)
SUT NYLON ETHILON 5-0 P-3 1X18 (SUTURE) IMPLANT
SUT SILK 2 0 REEL (SUTURE) IMPLANT
SUT SILK 2 0 SH (SUTURE) ×2 IMPLANT
SUT SILK 2 0 SH CR/8 (SUTURE) ×3 IMPLANT
SUT SILK 3 0 REEL (SUTURE) ×3 IMPLANT
SUT SILK 4 0 TIES 17X18 (SUTURE) ×3 IMPLANT
SUT VIC AB 3-0 FS2 27 (SUTURE) IMPLANT
SUT VIC AB 4-0 PS2 27 (SUTURE) ×2 IMPLANT
SUT VICRYL 4-0 PS2 18IN ABS (SUTURE) IMPLANT
TRAY ENT MC OR (CUSTOM PROCEDURE TRAY) ×3 IMPLANT
TUBE ENDOTRAC EMG 7X10.2 (MISCELLANEOUS) IMPLANT
TUBE ENDOTRAC EMG 8X11.3 (MISCELLANEOUS) IMPLANT
TUBE ENDOTRACH  EMG 6MMTUBE EN (MISCELLANEOUS)
TUBE ENDOTRACH EMG 6MMTUBE EN (MISCELLANEOUS) IMPLANT
WATER STERILE IRR 1000ML POUR (IV SOLUTION) ×3 IMPLANT

## 2013-12-13 NOTE — Discharge Instructions (Signed)
Eat and drink liquids and soft foods and only. Rinse mouth with saltwater 3 times daily. Brush teeth as you normally do.

## 2013-12-13 NOTE — Anesthesia Postprocedure Evaluation (Signed)
Anesthesia Post Note  Patient: Austin Ward  Procedure(s) Performed: Procedure(s) (LRB): EXCISION FLOOR OF MOUTH TUMOR/SUBMANDIBULAR DUCT REROUTING/EXCISIONAL BIOPSY OF LYMPHNODE (N/A)  Anesthesia type: general  Patient location: PACU  Post pain: Pain level controlled  Post assessment: Patient's Cardiovascular Status Stable  Last Vitals:  Filed Vitals:   12/13/13 1441  BP: 165/54  Pulse: 67  Temp: 36.2 C  Resp: 14    Post vital signs: Reviewed and stable  Level of consciousness: sedated  Complications: No apparent anesthesia complications

## 2013-12-13 NOTE — Anesthesia Preprocedure Evaluation (Signed)
Anesthesia Evaluation    Airway       Dental   Pulmonary former smoker,          Cardiovascular hypertension,     Neuro/Psych    GI/Hepatic   Endo/Other    Renal/GU      Musculoskeletal   Abdominal   Peds  Hematology   Anesthesia Other Findings   Reproductive/Obstetrics                           Anesthesia Physical Anesthesia Plan  ASA: III  Anesthesia Plan: General   Post-op Pain Management:    Induction: Intravenous  Airway Management Planned: Oral ETT  Additional Equipment:   Intra-op Plan:   Post-operative Plan: Extubation in OR  Informed Consent: I have reviewed the patients History and Physical, chart, labs and discussed the procedure including the risks, benefits and alternatives for the proposed anesthesia with the patient or authorized representative who has indicated his/her understanding and acceptance.   Dental advisory given  Plan Discussed with: CRNA, Anesthesiologist and Surgeon  Anesthesia Plan Comments:         Anesthesia Quick Evaluation

## 2013-12-13 NOTE — Transfer of Care (Signed)
Immediate Anesthesia Transfer of Care Note  Patient: Austin Ward  Procedure(s) Performed: Procedure(s): EXCISION FLOOR OF MOUTH TUMOR/SUBMANDIBULAR DUCT REROUTING/EXCISIONAL BIOPSY OF LYMPHNODE (N/A)  Patient Location: PACU  Anesthesia Type:General  Level of Consciousness: awake, alert , oriented and patient cooperative  Airway & Oxygen Therapy: Patient Spontanous Breathing and Patient connected to nasal cannula oxygen  Post-op Assessment: Report given to PACU RN, Post -op Vital signs reviewed and stable and Patient moving all extremities  Post vital signs: Reviewed and stable  Complications: No apparent anesthesia complications

## 2013-12-13 NOTE — Interval H&P Note (Signed)
History and Physical Interval Note:  12/13/2013 11:42 AM  Austin Ward  has presented today for surgery, with the diagnosis of history of tongue cancer/history of mouth cancer  The various methods of treatment have been discussed with the patient and family. After consideration of risks, benefits and other options for treatment, the patient has consented to  Procedure(s): EXCISION FLOOR OF MOUTH TUMOR/SUBMANDIBULAR DUCT REROUTING/EXCISIONAL BIOPSY OF LYMPHNODE (N/A) as a surgical intervention .  The patient's history has been reviewed, patient examined, no change in status, stable for surgery.  I have reviewed the patient's chart and labs.  Questions were answered to the patient's satisfaction.     Jkai Arwood

## 2013-12-13 NOTE — Progress Notes (Signed)
   ENT Progress Note: s/p Procedure(s): EXCISION FLOOR OF MOUTH TUMOR/SUBMANDIBULAR DUCT REROUTING/EXCISIONAL BIOPSY OF LYMPHNODE   Subjective: C/O pain and mild swelling  Objective: Vital signs in last 24 hours: Temp:  [97 F (36.1 C)-97.1 F (36.2 C)] 97.1 F (36.2 C) (08/12 1441) Pulse Rate:  [65-81] 67 (08/12 1441) Resp:  [11-15] 14 (08/12 1441) BP: (155-165)/(54-57) 165/54 mmHg (08/12 1441) SpO2:  [95 %-100 %] 100 % (08/12 1441) Weight:  [77.3 kg (170 lb 6.7 oz)] 77.3 kg (170 lb 6.7 oz) (08/12 1441) Weight change:  Last BM Date: 12/12/13  Intake/Output from previous day:   Intake/Output this shift: Total I/O In: 700 [I.V.:700] Out: 100 [Urine:100]  Labs: No results found for this basename: WBC, HGB, HCT, PLT,  in the last 72 hours No results found for this basename: NA, K, CL, CO2, GLUCOSE, BUN, CREATININR, CALCIUM,  in the last 72 hours  Studies/Results: No results found.   PHYSICAL EXAM: FOM stable, no bleeding or swelling Neck inc intact   Assessment/Plan: Pt stable PO liquid and soft as tol Monitor o/n    Austin Ward 12/13/2013, 4:15 PM

## 2013-12-13 NOTE — Op Note (Signed)
OPERATIVE REPORT  DATE OF SURGERY: 12/13/2013  PATIENT:  Austin Ward,  80 y.o. male  PRE-OPERATIVE DIAGNOSIS:  history of floor of mouth cancer  POST-OPERATIVE DIAGNOSIS: floor of mouth cancer  PROCEDURE:  Procedure(s): EXCISION FLOOR OF MOUTH TUMOR/EXCISIONAL BIOPSY OF LYMPHNODE  SURGEON:  Beckie Salts, MD  ASSISTANTS: Jolene Provost PA  ANESTHESIA:   General   EBL:  25 ml  DRAINS: None   LOCAL MEDICATIONS USED:  None  SPECIMEN:  Right submandibular lymph nodes, frozen section negative for carcinoma. Floor of mouth lesion, short suture marks left margin and long suture marks posterior margin. Positive for squamous cell carcinoma, right margin positive. Additional specimen, additional right margin sent for routine pathology.  COUNTS:  Correct  PROCEDURE DETAILS: The patient was taken to the operating room and placed on the operating table in the supine position. Following induction of general endotracheal anesthesia the neck was prepped and draped in a standard fashion. 1. Excisional biopsy submandibular nodes. An incision was outlined with a marking pen about 2 fingerbreadths below the mandible. Electrocautery was used to incise the skin and subcutaneous tissue. Blunt dissection was used to expose the 2 lymph nodes which were joined and were removed together. Bipolar cautery was used on the vascular attachments. Frozen section analysis was negative for carcinoma. No other nodes were palpable. Hemostasis was completed with bipolar cautery and the wound is closed in layers using interrupted chromic suture with Dermabond on the skin.  2. Excision of floor of mouth mass. Using a bite-block to keep the mouth open and gentle retraction on the tongue, the floor of mouth was inspected using the operating microscope. Electrocautery was used to make marks around the mucosa around the anterior floor of mouth irregular mucosa. Grossly adequate margins were obtained. The central lesion  was removed in its entirety. Prior to resection multiple attempts were made to milk the submandibular glands to try to identify the duct but there was no salivary flow present. The resection was accomplished. Frozen section revealed carcinoma with positive right margin of resection. An additional 6 or 7 mm of mucosa on the right along the entire right margin was then re-resected and sent separately as a routine pathologic specimen. This right margin grossly appeared completely normal. The defect was closed using interrupted Vicryl suture. The root of the tongue part was closed easily. The anterior defect adjacent to the mandible could not be closing was left open to heal secondarily. The oral cavity was rinsed with saline and suctioned. The  bite-block was removed. The patient was awakened extubated and transferred to recovery in stable condition    PATIENT DISPOSITION:  To PACU, stable

## 2013-12-14 ENCOUNTER — Encounter (HOSPITAL_COMMUNITY): Payer: Self-pay | Admitting: Otolaryngology

## 2013-12-14 DIAGNOSIS — C049 Malignant neoplasm of floor of mouth, unspecified: Secondary | ICD-10-CM | POA: Diagnosis not present

## 2013-12-14 MED ORDER — ENSURE COMPLETE PO LIQD: 237.0000 mL | Freq: Two times a day (BID) | ORAL | Status: DC

## 2013-12-14 NOTE — Progress Notes (Signed)
Patient and his nephew given discharge instructions, prescriptions and follow-up information.  They verbalized understanding of all instructions.  Patient ready for discharge and taken down via wheelchair to go home with his nephew and wife.

## 2013-12-14 NOTE — Progress Notes (Signed)
Subjective: No complaints, minor pain in the oral cavity. He is able to eat and drink a little bit but he doesn't like any of the selection.  Objective: Vital signs in last 24 hours: Temp:  [97 F (36.1 C)-98.6 F (37 C)] 98 F (36.7 C) (08/13 1840) Pulse Rate:  [65-81] 66 (08/13 0608) Resp:  [11-18] 18 (08/13 0608) BP: (134-165)/(51-60) 134/51 mmHg (08/13 0608) SpO2:  [94 %-100 %] 100 % (08/13 0608) Weight:  [170 lb 6.7 oz (77.3 kg)] 170 lb 6.7 oz (77.3 kg) (08/12 1441) Weight change:  Last BM Date: 12/12/13  Intake/Output from previous day: 08/12 0701 - 08/13 0700 In: 940 [P.O.:240; I.V.:700] Out: 1025 [Urine:1025] Intake/Output this shift:    PHYSICAL EXAM: Able to breathe and speak nicely. Neck not swollen. Oral cavity wound intact.  Lab Results: No results found for this basename: WBC, HGB, HCT, PLT,  in the last 72 hours BMET No results found for this basename: NA, K, CL, CO2, GLUCOSE, BUN, CREATININE, CALCIUM,  in the last 72 hours  Studies/Results: No results found.  Medications: I have reviewed the patient's current medications.  Assessment/Plan: Postop day 1, progressing well. He may go home when he is eating and drinking sufficiently.  LOS: 1 day   Lashala Laser 12/14/2013, 8:31 AM

## 2013-12-14 NOTE — Progress Notes (Signed)
INITIAL NUTRITION ASSESSMENT  DOCUMENTATION CODES Per approved criteria  -Not Applicable   INTERVENTION: Ensure Complete po BID, each supplement provides 350 kcal and 13 grams of protein  NUTRITION DIAGNOSIS: Inadequate oral intake related to tongue and mouth cancer as evidenced by reported intake less than estimated needs.   Goal: Pt to meet >/= 90% of their estimated nutrition needs   Monitor:  Weight trends, po intake, acceptance of supplements, labs  Reason for Assessment: MST  80 y.o. male  Admitting Dx: <principal problem not specified>  ASSESSMENT: Pt s/p: Procedure(s):  EXCISION FLOOR OF MOUTH TUMOR/SUBMANDIBULAR DUCT REROUTING/EXCISIONAL BIOPSY OF LYMPHNODE (N/A)  - Pt reports that he was eating well prior to admission with no recent weight loss. He is on a soft diet and reports that he "does not like the selection." He also says he doesn't have an appetite at this time. Pt will be sent nutritional supplements while in hospital to increase intake of calories and protein.  - Pt with no signs of fat or muscle wasting at this time.  Na and K WNL  Height: Ht Readings from Last 1 Encounters:  12/13/13 5\' 9"  (1.753 m)    Weight: Wt Readings from Last 1 Encounters:  12/13/13 170 lb 6.7 oz (77.3 kg)    Ideal Body Weight: 70.7 kg  % Ideal Body Weight: 109%  Wt Readings from Last 10 Encounters:  12/13/13 170 lb 6.7 oz (77.3 kg)  12/13/13 170 lb 6.7 oz (77.3 kg)  12/08/13 170 lb 6.7 oz (77.3 kg)  09/22/13 172 lb 6.4 oz (78.2 kg)  09/11/13 173 lb (78.472 kg)  07/31/13 173 lb (78.472 kg)  07/21/13 168 lb 1.6 oz (76.25 kg)  06/14/13 175 lb (79.379 kg)  04/10/13 176 lb 11.2 oz (80.151 kg)  09/07/12 175 lb (79.379 kg)    Usual Body Weight: 170 lbs  % Usual Body Weight: 100%  BMI:  Body mass index is 25.15 kg/(m^2).  Estimated Nutritional Needs: Kcal: 2100-2300 Protein: 100-110 g Fluid: 2.1-2.3 L/day  Skin: Incision on neck (R)  Diet Order:  Criss Rosales  EDUCATION NEEDS: -Education needs addressed   Intake/Output Summary (Last 24 hours) at 12/14/13 1208 Last data filed at 12/14/13 0940  Gross per 24 hour  Intake   1180 ml  Output   1325 ml  Net   -145 ml    Last BM: prior to admission   Labs:   Recent Labs Lab 12/08/13 1442  NA 143  K 4.4  CL 103  CO2 27  BUN 26*  CREATININE 1.23  CALCIUM 9.3  GLUCOSE 111*    CBG (last 3)  No results found for this basename: GLUCAP,  in the last 72 hours  Scheduled Meds: . aspirin EC  81 mg Oral Daily  . cholecalciferol  2,000 Units Oral Daily  . citric acid-sodium citrate  30 mL Oral QPC supper  . citric acid-sodium citrate  45 mL Oral QPC breakfast  . clindamycin  300 mg Oral 3 times per day  . diltiazem  180 mg Oral Daily  . finasteride  5 mg Oral Daily  . metoprolol succinate  25 mg Oral Daily  . pantoprazole  40 mg Oral Daily  . potassium chloride  10 mEq Oral Daily  . propylthiouracil  100 mg Oral q morning - 10a  . propylthiouracil  50 mg Oral QPM  . [START ON 12/18/2013] rosuvastatin  10 mg Oral Q Mon-1800  . tamsulosin  0.8 mg Oral QHS  . triamterene-hydrochlorothiazide  1 tablet Oral Daily    Continuous Infusions: . dextrose 5 % and 0.9% NaCl 75 mL/hr at 12/13/13 1633  . lactated ringers 10 mL/hr at 12/14/13 8101    Past Medical History  Diagnosis Date  . CAD (coronary artery disease)   . S/P CABG x 4 02/27/1994    LIMA to LAD,SVG to OM,SVG to 2nd OM,SVG to RCA-Dr. Cyndia Bent  . Heart murmur   . Carotid arterial disease     07/04/12 doppler:right ICA 50-69%,left bulb & ICA 0-49%  . Systemic hypertension   . Dyslipidemia   . Bilateral carotid artery stenosis 04/16/2013  . Aortic valve stenosis 04/16/2013    1.5 cm by echo March 2014  . Essential hypertension 04/16/2013    Did not tolerate higher doses of statins. Satisfactory control on once weekly Crestor   . Occasional tremors     mostly right hand  . Enlarged prostate   . GERD (gastroesophageal  reflux disease)   . Cancer     oral cancer  . History of shingles 2005  . Hard of hearing   . Hyperthyroidism     Past Surgical History  Procedure Laterality Date  . Coronary artery bypass graft  02/27/1994    LIMA to LAD,SVG to OM,SVG to 2nd OM,SVG to RCA  . Intraocular lens insertion  2000  . Cardiac catheterization  02/25/1994  . US echocardiography  07/04/2012    mild to mod AOV stenosis,mitral annular ca+,LA mildly to mod. dilated  . Nm myoview ltd  04/01/2010    no ischemia, EF 60%  . Eye surgery Bilateral     cataract surgery  . Tonsillectomy    . Colonoscopy    . Mouth biopsy  12/13/2013    DR ROSEN  . Floor of mouth biopsy N/A 12/13/2013    Procedure: EXCISION FLOOR OF MOUTH TUMOR/SUBMANDIBULAR DUCT REROUTING/EXCISIONAL BIOPSY OF LYMPHNODE;  Surgeon: Izora Gala, MD;  Location: [place];  Service: ENT;  Laterality: N/A;    Terrace Arabia RD, LDN

## 2013-12-14 NOTE — Discharge Summary (Signed)
Physician Discharge Summary  Patient ID: Austin Ward MRN: [id number] DOB/AGE: 1924-01-05 80 y.o.  Admit date: 12/13/2013 Discharge date: 12/14/2013  Admission Diagnoses:Floor of mouth cancer  Discharge Diagnoses:  Active Problems:   Cancer of floor of mouth   Discharged Condition: good  Hospital Course: no complications  Consults: none  Significant Diagnostic Studies: none  Treatments: surgery: Floor of mouth resection, lymph node bx  Discharge Exam: Blood pressure 135/62, pulse 61, temperature 98.2 F (36.8 C), temperature source Oral, resp. rate 18, height 5\' 9"  (1.753 m), weight 170 lb 6.7 oz (77.3 kg), SpO2 98.00%. PHYSICAL EXAM: Doing great, no swelling or infection.  Disposition: 01-Home or Self Care  Discharge Instructions   Diet - low sodium heart healthy    Complete by:  As directed   Soft foods only.     Increase activity slowly    Complete by:  As directed             Medication List         acetaminophen 500 MG tablet  Commonly known as:  TYLENOL  Take 1,000 mg by mouth every 6 (six) hours as needed for mild pain, moderate pain or fever.     aspirin EC 81 MG tablet  Take 81 mg by mouth daily.     clindamycin 300 MG capsule  Commonly known as:  CLEOCIN  Take 1 capsule (300 mg total) by mouth 3 (three) times daily.     diltiazem 180 MG 24 hr capsule  Commonly known as:  CARDIZEM CD  Take 180 mg by mouth daily.     finasteride 5 MG tablet  Commonly known as:  PROSCAR  Take 5 mg by mouth daily.     HYDROcodone-acetaminophen 7.5-325 MG per tablet  Commonly known as:  NORCO  Take 1 tablet by mouth every 6 (six) hours as needed for moderate pain.     metoprolol succinate 25 MG 24 hr tablet  Commonly known as:  TOPROL-XL  Take 25 mg by mouth daily.     ORACIT 490-640 MG/5ML Soln  Generic drug:  sod citrate-citric acid  Take 15 mLs by mouth 2 (two) times daily after a meal. Take 3 tablespoons in the morning and 2 tablespoons at night      pantoprazole 40 MG tablet  Commonly known as:  PROTONIX  Take 1 tablet (40 mg total) by mouth daily.     potassium chloride 10 MEQ tablet  Commonly known as:  K-DUR,KLOR-CON  Take 10 mEq by mouth daily.     promethazine 25 MG suppository  Commonly known as:  PHENERGAN  Place 1 suppository (25 mg total) rectally every 6 (six) hours as needed for nausea or vomiting.     propylthiouracil 50 MG tablet  Commonly known as:  PTU  Take 50-100 mg by mouth 2 (two) times daily. Take 2 tablets (100 mg total) by mouth in the morning, take 1 tablet (50 mg total) by mouth in the evening.     rosuvastatin 10 MG tablet  Commonly known as:  CRESTOR  Take 10 mg by mouth once a week. Monday     tamsulosin 0.4 MG Caps capsule  Commonly known as:  FLOMAX  Take 0.8 mg by mouth daily. At bedtime     triamcinolone 0.1 % paste  Commonly known as:  KENALOG     triamterene-hydrochlorothiazide 37.5-25 MG per tablet  Commonly known as:  MAXZIDE-25  Take 1 tablet by mouth daily.     Vitamin  D3 2000 UNITS Tabs  Take 2,000 Int'l Units by mouth daily.           Follow-up Information   Follow up with Izora Gala, MD. Schedule an appointment as soon as possible for a visit in 1 week.   Specialty:  Otolaryngology   Contact information:   [address] [place] [address] [phone]       Signed: Izora Gala 12/14/2013, 12:18 PM

## 2014-01-18 ENCOUNTER — Other Ambulatory Visit: Payer: Self-pay | Admitting: Otolaryngology

## 2014-01-18 DIAGNOSIS — R911 Solitary pulmonary nodule: Secondary | ICD-10-CM

## 2014-02-26 ENCOUNTER — Telehealth: Payer: Self-pay | Admitting: *Deleted

## 2014-02-26 NOTE — Telephone Encounter (Signed)
Handicap parking application filled out and mailed to patient.

## 2014-03-14 ENCOUNTER — Ambulatory Visit (INDEPENDENT_AMBULATORY_CARE_PROVIDER_SITE_OTHER): Payer: Medicare Other | Admitting: Podiatry

## 2014-03-14 ENCOUNTER — Encounter: Payer: Self-pay | Admitting: Podiatry

## 2014-03-14 VITALS — BP 144/56 | HR 64

## 2014-03-14 DIAGNOSIS — M79606 Pain in leg, unspecified: Secondary | ICD-10-CM

## 2014-03-14 DIAGNOSIS — B351 Tinea unguium: Secondary | ICD-10-CM

## 2014-03-14 NOTE — Progress Notes (Signed)
Subjective:  80 y.o. year old male patient presents requesting toe nails trimmed.  Denies any new problems.   Objective: Dermatologic: Thick yellow deformed nails x 10.  Vascular: Pedal pulses are not palpable on both DP and PT bilateral.  No edema or erythema noted.  Orthopedic: High arched cavus foot with contracted lesser digits x 10.  Neurologic: All epicritic and tactile sensations grossly intact.   Assessment:  Dystrophic mycotic nails x 10.  Painful feet.  PVD.   Treatment: All mycotic nails debrided.  Return in 3 months or as needed

## 2014-03-14 NOTE — Patient Instructions (Signed)
Seen for hypertrophic nails. All nails debrided. Return in 3 months or as needed.  

## 2014-04-02 ENCOUNTER — Encounter: Payer: Self-pay | Admitting: Cardiovascular Disease

## 2014-04-09 ENCOUNTER — Other Ambulatory Visit (HOSPITAL_COMMUNITY)
Admission: RE | Admit: 2014-04-09 | Discharge: 2014-04-09 | Disposition: A | Discharge status: Home / Self Care | Payer: Medicare Other | Source: Ambulatory Visit | Attending: Otolaryngology | Admitting: Otolaryngology

## 2014-04-09 DIAGNOSIS — M79606 Pain in leg, unspecified: Secondary | ICD-10-CM | POA: Insufficient documentation

## 2014-04-09 DIAGNOSIS — B351 Tinea unguium: Secondary | ICD-10-CM | POA: Insufficient documentation

## 2014-04-10 ENCOUNTER — Other Ambulatory Visit: Payer: Self-pay | Admitting: Otolaryngology

## 2014-04-17 ENCOUNTER — Encounter (HOSPITAL_COMMUNITY): Payer: Self-pay | Admitting: *Deleted

## 2014-04-17 ENCOUNTER — Ambulatory Visit: Payer: Self-pay | Admitting: Otolaryngology

## 2014-04-17 NOTE — Progress Notes (Signed)
I spoke with Dr Janeice Robinson nurse and she said she would call patient and tell him to stop Aspirin.

## 2014-04-17 NOTE — H&P (Signed)
  Assessment  Cervical lymphadenopathy (785.6) (R59.0). Discussed  No new complaints. On exam, the tongue and floor of mouth are healthy. There is no visible or palpable mass. The right neck is free of any palpable masses. There is a palpable submandibular node on the left. FNA was performed. We will discuss results when available. Reason For Visit  Follow up for floor of the mouth cancer. Allergies  No Known Drug Allergies. Current Meds  Propylthiouracil 50 MG Oral Tablet;; RPT Metoprolol Tartrate 50 MG Oral Tablet;; RPT Triamterene-HCTZ 37.5-25 MG Oral Tablet;; RPT Aspirin Low Dose 81 MG Oral Tablet;; RPT Cartia XT 180 MG/24HR CPCR;; RPT Protonix 40 MG Oral Tablet Delayed Release (Pantoprazole Sodium);; RPT Acetaminophen TABS;; RPT Oracit SOLN;; RPT Klor-Con TBCR;; RPT Crestor TABS;; RPT Finasteride TABS;; RPT Vitamin D3 TABS;; RPT Tamsulosin HCl CAPS;; RPT. 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 PMH  History of malignant neoplasm (V10.90) (Z85.9); Resolved: [id number]. History of tongue cancer (V10.01) (Z85.810); Resolved: [id number]. Family Hx  Family history of cardiac disorder: Mother (V17.49) (Z82.49). Personal Hx  Never smoker No alcohol use No caffeine use Non-smoker (V49.89) (Z78.9). Procedure  FNA The risks and benefits of this procedure have been thoroughly discussed with the patient.  The most commons risks outlined included but were not limited to: injury  to the nasal mucosa or throat irritation.  The patient was further informed that there are other less common risks.  The patient was given the opportunity to ask questions and all such questions were answered to the patient's  satisfaction.  Patient acknowledged the risks and has agreed to proceed.   Preop Diagnosis:  Procedure: Using a 10 cc syringe with a 22 gauge needle, the mass aspirated. This was repeated with a separate needle a second time. Cytology samples were prepared. A dressing was applied. Tolerance: Excellent Unplanned interventions: None  Unplanned events: No complications. Signature  Electronically signed by : Izora Gala  M.D.; 04/09/2014 2:00 PM EST.

## 2014-04-18 MED ORDER — CEFAZOLIN SODIUM-DEXTROSE 2-3 GM-% IV SOLR
2.0000 g | INTRAVENOUS | Status: AC
  Administered 2014-04-19: 2 g via INTRAVENOUS
  Filled 2014-04-18: qty 50

## 2014-04-19 ENCOUNTER — Inpatient Hospital Stay (HOSPITAL_COMMUNITY): Payer: Medicare Other | Admitting: Anesthesiology

## 2014-04-19 ENCOUNTER — Encounter (HOSPITAL_COMMUNITY)
Admission: RE | Disposition: A | Discharge status: Home / Self Care | Payer: Self-pay | Source: Ambulatory Visit | Attending: Otolaryngology

## 2014-04-19 ENCOUNTER — Observation Stay (HOSPITAL_COMMUNITY)
Admission: RE | Admit: 2014-04-19 | Discharge: 2014-04-20 | Disposition: A | Discharge status: Home / Self Care | Payer: Medicare Other | Source: Ambulatory Visit | Attending: Otolaryngology | Admitting: Otolaryngology

## 2014-04-19 ENCOUNTER — Encounter (HOSPITAL_COMMUNITY): Payer: Self-pay | Admitting: *Deleted

## 2014-04-19 DIAGNOSIS — I739 Peripheral vascular disease, unspecified: Secondary | ICD-10-CM | POA: Insufficient documentation

## 2014-04-19 DIAGNOSIS — K1123 Chronic sialoadenitis: Secondary | ICD-10-CM | POA: Diagnosis not present

## 2014-04-19 DIAGNOSIS — Z951 Presence of aortocoronary bypass graft: Secondary | ICD-10-CM | POA: Diagnosis not present

## 2014-04-19 DIAGNOSIS — I350 Nonrheumatic aortic (valve) stenosis: Secondary | ICD-10-CM | POA: Insufficient documentation

## 2014-04-19 DIAGNOSIS — K219 Gastro-esophageal reflux disease without esophagitis: Secondary | ICD-10-CM | POA: Diagnosis not present

## 2014-04-19 DIAGNOSIS — I2510 Atherosclerotic heart disease of native coronary artery without angina pectoris: Secondary | ICD-10-CM | POA: Diagnosis not present

## 2014-04-19 DIAGNOSIS — E0590 Thyrotoxicosis, unspecified without thyrotoxic crisis or storm: Secondary | ICD-10-CM | POA: Diagnosis not present

## 2014-04-19 DIAGNOSIS — Z7982 Long term (current) use of aspirin: Secondary | ICD-10-CM | POA: Diagnosis not present

## 2014-04-19 DIAGNOSIS — Z87891 Personal history of nicotine dependence: Secondary | ICD-10-CM | POA: Insufficient documentation

## 2014-04-19 DIAGNOSIS — Z888 Allergy status to other drugs, medicaments and biological substances status: Secondary | ICD-10-CM | POA: Diagnosis not present

## 2014-04-19 DIAGNOSIS — K1120 Sialoadenitis, unspecified: Secondary | ICD-10-CM | POA: Diagnosis present

## 2014-04-19 HISTORY — PX: RADICAL NECK DISSECTION: SHX2284

## 2014-04-19 LAB — CBC
HCT: 39 % (ref 39.0–52.0)
Hemoglobin: 12.7 g/dL — ABNORMAL LOW (ref 13.0–17.0)
MCH: 27.9 pg (ref 26.0–34.0)
MCHC: 32.6 g/dL (ref 30.0–36.0)
MCV: 85.7 fL (ref 78.0–100.0)
Platelets: 210 10*3/uL (ref 150–400)
RBC: 4.55 MIL/uL (ref 4.22–5.81)
RDW: 13.7 % (ref 11.5–15.5)
WBC: 6.7 10*3/uL (ref 4.0–10.5)

## 2014-04-19 LAB — BASIC METABOLIC PANEL WITH GFR
Anion gap: 11 (ref 5–15)
BUN: 30 mg/dL — ABNORMAL HIGH (ref 6–23)
CO2: 27 meq/L (ref 19–32)
Calcium: 9.3 mg/dL (ref 8.4–10.5)
Chloride: 103 meq/L (ref 96–112)
Creatinine, Ser: 1.2 mg/dL (ref 0.50–1.35)
GFR calc Af Amer: 60 mL/min — ABNORMAL LOW
GFR calc non Af Amer: 52 mL/min — ABNORMAL LOW
Glucose, Bld: 106 mg/dL — ABNORMAL HIGH (ref 70–99)
Potassium: 3.9 meq/L (ref 3.7–5.3)
Sodium: 141 meq/L (ref 137–147)

## 2014-04-19 SURGERY — DISSECTION, NECK, RADICAL
Anesthesia: General | Site: Neck | Laterality: Left

## 2014-04-19 MED ORDER — DILTIAZEM HCL ER COATED BEADS 180 MG PO CP24
180.0000 mg | ORAL_CAPSULE | Freq: Every day | ORAL | Status: DC
  Administered 2014-04-19 – 2014-04-20 (×2): 180 mg via ORAL
  Filled 2014-04-19 (×2): qty 1

## 2014-04-19 MED ORDER — PROPOFOL 10 MG/ML IV BOLUS
INTRAVENOUS | Status: AC
  Filled 2014-04-19: qty 20

## 2014-04-19 MED ORDER — PANTOPRAZOLE SODIUM 40 MG PO TBEC
40.0000 mg | DELAYED_RELEASE_TABLET | Freq: Every day | ORAL | Status: DC
  Administered 2014-04-20: 40 mg via ORAL
  Filled 2014-04-19: qty 1

## 2014-04-19 MED ORDER — FINASTERIDE 5 MG PO TABS
5.0000 mg | ORAL_TABLET | Freq: Every day | ORAL | Status: DC
  Administered 2014-04-19 – 2014-04-20 (×2): 5 mg via ORAL
  Filled 2014-04-19 (×2): qty 1

## 2014-04-19 MED ORDER — LACTATED RINGERS IV SOLN
INTRAVENOUS | Status: DC
  Administered 2014-04-19 (×2): via INTRAVENOUS

## 2014-04-19 MED ORDER — FENTANYL CITRATE 0.05 MG/ML IJ SOLN
INTRAMUSCULAR | Status: DC | PRN
  Administered 2014-04-19: 75 ug via INTRAVENOUS

## 2014-04-19 MED ORDER — PROPYLTHIOURACIL 50 MG PO TABS
100.0000 mg | ORAL_TABLET | Freq: Two times a day (BID) | ORAL | Status: DC
  Administered 2014-04-19 – 2014-04-20 (×2): 100 mg via ORAL
  Filled 2014-04-19 (×3): qty 2

## 2014-04-19 MED ORDER — LIDOCAINE HCL (CARDIAC) 20 MG/ML IV SOLN
INTRAVENOUS | Status: AC
  Filled 2014-04-19: qty 5

## 2014-04-19 MED ORDER — LIDOCAINE-EPINEPHRINE 1 %-1:100000 IJ SOLN
INTRAMUSCULAR | Status: AC
  Filled 2014-04-19: qty 1

## 2014-04-19 MED ORDER — ROSUVASTATIN CALCIUM 10 MG PO TABS: 10.0000 mg | ORAL_TABLET | ORAL | Status: DC

## 2014-04-19 MED ORDER — TRIAMTERENE-HCTZ 37.5-25 MG PO TABS
1.0000 | ORAL_TABLET | Freq: Every day | ORAL | Status: DC
  Administered 2014-04-19 – 2014-04-20 (×2): 1 via ORAL
  Filled 2014-04-19 (×2): qty 1

## 2014-04-19 MED ORDER — HYDROMORPHONE HCL 1 MG/ML IJ SOLN
0.2500 mg | INTRAMUSCULAR | Status: DC | PRN
  Administered 2014-04-19: 0.25 mg via INTRAVENOUS
  Administered 2014-04-19: 0.5 mg via INTRAVENOUS

## 2014-04-19 MED ORDER — BACITRACIN ZINC 500 UNIT/GM EX OINT
TOPICAL_OINTMENT | CUTANEOUS | Status: AC
  Filled 2014-04-19: qty 15

## 2014-04-19 MED ORDER — POTASSIUM CHLORIDE CRYS ER 10 MEQ PO TBCR
10.0000 meq | EXTENDED_RELEASE_TABLET | Freq: Every day | ORAL | Status: DC
  Administered 2014-04-19 – 2014-04-20 (×2): 10 meq via ORAL
  Filled 2014-04-19 (×2): qty 1

## 2014-04-19 MED ORDER — PROMETHAZINE HCL 25 MG/ML IJ SOLN: 6.2500 mg | INTRAMUSCULAR | Status: DC | PRN

## 2014-04-19 MED ORDER — TAMSULOSIN HCL 0.4 MG PO CAPS
0.8000 mg | ORAL_CAPSULE | Freq: Every day | ORAL | Status: DC
  Administered 2014-04-19: 0.8 mg via ORAL
  Filled 2014-04-19: qty 2

## 2014-04-19 MED ORDER — HYDROCODONE-ACETAMINOPHEN 5-325 MG PO TABS: 1.0000 | ORAL_TABLET | ORAL | Status: DC | PRN

## 2014-04-19 MED ORDER — METOPROLOL SUCCINATE ER 25 MG PO TB24
25.0000 mg | ORAL_TABLET | Freq: Every day | ORAL | Status: DC
  Administered 2014-04-20: 25 mg via ORAL
  Filled 2014-04-19: qty 1

## 2014-04-19 MED ORDER — ASPIRIN EC 81 MG PO TBEC
81.0000 mg | DELAYED_RELEASE_TABLET | Freq: Every day | ORAL | Status: DC
  Administered 2014-04-19 – 2014-04-20 (×2): 81 mg via ORAL
  Filled 2014-04-19 (×2): qty 1

## 2014-04-19 MED ORDER — FENTANYL CITRATE 0.05 MG/ML IJ SOLN
INTRAMUSCULAR | Status: AC
Start: 2014-04-19 — End: 2014-04-19
  Filled 2014-04-19: qty 5

## 2014-04-19 MED ORDER — EPHEDRINE SULFATE 50 MG/ML IJ SOLN
INTRAMUSCULAR | Status: DC | PRN
  Administered 2014-04-19: 5 mg via INTRAVENOUS
  Administered 2014-04-19: 2.5 mg via INTRAVENOUS

## 2014-04-19 MED ORDER — ROCURONIUM BROMIDE 50 MG/5ML IV SOLN
INTRAVENOUS | Status: AC
  Filled 2014-04-19: qty 1

## 2014-04-19 MED ORDER — PROPOFOL 10 MG/ML IV BOLUS
INTRAVENOUS | Status: DC | PRN
  Administered 2014-04-19: 30 mg via INTRAVENOUS
  Administered 2014-04-19: 80 mg via INTRAVENOUS
  Administered 2014-04-19: 30 mg via INTRAVENOUS

## 2014-04-19 MED ORDER — 0.9 % SODIUM CHLORIDE (POUR BTL) OPTIME
TOPICAL | Status: DC | PRN
  Administered 2014-04-19: 1000 mL

## 2014-04-19 MED ORDER — ROCURONIUM BROMIDE 100 MG/10ML IV SOLN: INTRAVENOUS | Status: DC | PRN

## 2014-04-19 MED ORDER — SUCCINYLCHOLINE CHLORIDE 20 MG/ML IJ SOLN
INTRAMUSCULAR | Status: DC | PRN
  Administered 2014-04-19: 100 mg via INTRAVENOUS

## 2014-04-19 MED ORDER — VITAMIN D 1000 UNITS PO TABS
2000.0000 [IU] | ORAL_TABLET | Freq: Every day | ORAL | Status: DC
  Administered 2014-04-20: 2000 [IU] via ORAL
  Filled 2014-04-19: qty 2

## 2014-04-19 MED ORDER — PROMETHAZINE HCL 25 MG PO TABS: 25.0000 mg | ORAL_TABLET | Freq: Four times a day (QID) | ORAL | Status: DC | PRN

## 2014-04-19 MED ORDER — HYDROCODONE-ACETAMINOPHEN 7.5-325 MG PO TABS: 1.0000 | ORAL_TABLET | Freq: Four times a day (QID) | ORAL | Status: DC | PRN

## 2014-04-19 MED ORDER — ONDANSETRON HCL 4 MG/2ML IJ SOLN
INTRAMUSCULAR | Status: AC
  Filled 2014-04-19: qty 2

## 2014-04-19 MED ORDER — ONDANSETRON HCL 4 MG/2ML IJ SOLN
INTRAMUSCULAR | Status: DC | PRN
  Administered 2014-04-19: 4 mg via INTRAVENOUS

## 2014-04-19 MED ORDER — PROMETHAZINE HCL 25 MG RE SUPP: 25.0000 mg | Freq: Four times a day (QID) | RECTAL | Status: DC | PRN

## 2014-04-19 MED ORDER — SOD CITRATE-CITRIC ACID 490-640 MG/5ML PO SOLN
15.0000 mL | Freq: Two times a day (BID) | ORAL | Status: DC
Start: 2014-04-19 — End: 2014-04-20
  Administered 2014-04-19: 15 mL via ORAL
  Filled 2014-04-19 (×4): qty 30

## 2014-04-19 MED ORDER — HYDROMORPHONE HCL 1 MG/ML IJ SOLN
INTRAMUSCULAR | Status: AC
  Administered 2014-04-19: 0.25 mg via INTRAVENOUS
  Filled 2014-04-19: qty 1

## 2014-04-19 MED ORDER — LIDOCAINE HCL (CARDIAC) 20 MG/ML IV SOLN
INTRAVENOUS | Status: DC | PRN
  Administered 2014-04-19: 80 mg via INTRAVENOUS

## 2014-04-19 MED ORDER — ACETAMINOPHEN 500 MG PO TABS: 1000.0000 mg | ORAL_TABLET | Freq: Four times a day (QID) | ORAL | Status: DC | PRN

## 2014-04-19 MED ORDER — IBUPROFEN 100 MG/5ML PO SUSP
400.0000 mg | Freq: Four times a day (QID) | ORAL | Status: DC | PRN
  Filled 2014-04-19: qty 20

## 2014-04-19 MED ORDER — DEXTROSE-NACL 5-0.9 % IV SOLN
INTRAVENOUS | Status: DC
  Administered 2014-04-19 – 2014-04-20 (×2): via INTRAVENOUS

## 2014-04-19 SURGICAL SUPPLY — 49 items
ADH SKN CLS LQ APL DERMABOND (GAUZE/BANDAGES/DRESSINGS) ×1
APPLIER CLIP 9.375 SM OPEN (CLIP)
APR CLP SM 9.3 20 MLT OPN (CLIP)
CANISTER SUCTION 2500CC (MISCELLANEOUS) ×3 IMPLANT
CLEANER TIP ELECTROSURG 2X2 (MISCELLANEOUS) ×3 IMPLANT
CLIP APPLIE 9.375 SM OPEN (CLIP) IMPLANT
CONT SPEC 4OZ CLIKSEAL STRL BL (MISCELLANEOUS) ×2 IMPLANT
CORDS BIPOLAR (ELECTRODE) ×3 IMPLANT
COVER SURGICAL LIGHT HANDLE (MISCELLANEOUS) ×3 IMPLANT
DERMABOND ADHESIVE PROPEN (GAUZE/BANDAGES/DRESSINGS) ×2
DERMABOND ADVANCED .7 DNX6 (GAUZE/BANDAGES/DRESSINGS) IMPLANT
DRAIN CHANNEL 15F RND FF W/TCR (WOUND CARE) IMPLANT
DRAIN SNY 10 ROU (WOUND CARE) ×2 IMPLANT
DRAPE INCISE 23X17 IOBAN STRL (DRAPES)
DRAPE INCISE 23X17 STRL (DRAPES) IMPLANT
DRAPE INCISE IOBAN 23X17 STRL (DRAPES) IMPLANT
ELECT COATED BLADE 2.86 ST (ELECTRODE) ×3 IMPLANT
ELECT REM PT RETURN 9FT ADLT (ELECTROSURGICAL) ×3
ELECTRODE REM PT RTRN 9FT ADLT (ELECTROSURGICAL) ×1 IMPLANT
EVACUATOR SILICONE 100CC (DRAIN) ×3 IMPLANT
FORCEPS BIPOLAR SPETZLER 8 1.0 (NEUROSURGERY SUPPLIES) ×3 IMPLANT
GAUZE SPONGE 4X4 16PLY XRAY LF (GAUZE/BANDAGES/DRESSINGS) ×3 IMPLANT
GLOVE ECLIPSE 7.5 STRL STRAW (GLOVE) ×3 IMPLANT
GOWN STRL REUS W/ TWL LRG LVL3 (GOWN DISPOSABLE) ×2 IMPLANT
GOWN STRL REUS W/TWL LRG LVL3 (GOWN DISPOSABLE) ×6
KIT BASIN OR (CUSTOM PROCEDURE TRAY) ×3 IMPLANT
KIT ROOM TURNOVER OR (KITS) ×3 IMPLANT
LIQUID BAND (GAUZE/BANDAGES/DRESSINGS) ×2 IMPLANT
LOCATOR NERVE 3 VOLT (DISPOSABLE) IMPLANT
NDL HYPO 25GX1X1/2 BEV (NEEDLE) ×1 IMPLANT
NEEDLE HYPO 25GX1X1/2 BEV (NEEDLE) ×3 IMPLANT
NS IRRIG 1000ML POUR BTL (IV SOLUTION) ×3 IMPLANT
PAD ARMBOARD 7.5X6 YLW CONV (MISCELLANEOUS) ×6 IMPLANT
PENCIL FOOT CONTROL (ELECTRODE) ×3 IMPLANT
SPECIMEN JAR MEDIUM (MISCELLANEOUS) ×2 IMPLANT
SPONGE INTESTINAL PEANUT (DISPOSABLE) IMPLANT
SPONGE LAP 18X18 X RAY DECT (DISPOSABLE) IMPLANT
STAPLER VISISTAT 35W (STAPLE) ×3 IMPLANT
SUT CHROMIC 3 0 SH 27 (SUTURE) ×3 IMPLANT
SUT CHROMIC 5 0 P 3 (SUTURE) IMPLANT
SUT ETHILON 3 0 PS 1 (SUTURE) ×3 IMPLANT
SUT ETHILON 5 0 PS 2 18 (SUTURE) IMPLANT
SUT SILK 2 0 REEL (SUTURE) IMPLANT
SUT SILK 3 0 SH CR/8 (SUTURE) ×3 IMPLANT
SUT SILK 4 0 REEL (SUTURE) ×3 IMPLANT
SUT VIC AB 3-0 SH 18 (SUTURE) IMPLANT
TOWEL OR 17X26 10 PK STRL BLUE (TOWEL DISPOSABLE) ×3 IMPLANT
TRAY ENT MC OR (CUSTOM PROCEDURE TRAY) ×3 IMPLANT
TRAY FOLEY CATH 14FRSI W/METER (CATHETERS) IMPLANT

## 2014-04-19 NOTE — Interval H&P Note (Signed)
History and Physical Interval Note:  04/19/2014 1:42 PM  Austin Ward  has presented today for surgery, with the diagnosis of ORAL CANCER  The various methods of treatment have been discussed with the patient and family. After consideration of risks, benefits and other options for treatment, the patient has consented to  Procedure(s): LEFT NECK DISSECTION (Left) as a surgical intervention .  The patient's history has been reviewed, patient examined, no change in status, stable for surgery.  I have reviewed the patient's chart and labs.  Questions were answered to the patient's satisfaction.     [name]

## 2014-04-19 NOTE — Anesthesia Postprocedure Evaluation (Signed)
  Anesthesia Post-op Note  Patient: Austin Ward  Procedure(s) Performed: Procedure(s): LEFT NECK DISSECTION (Left)  Patient Location: PACU  Anesthesia Type:General  Level of Consciousness: awake, oriented, sedated and patient cooperative  Airway and Oxygen Therapy: Patient Spontanous Breathing  Post-op Pain: mild  Post-op Assessment: Post-op Vital signs reviewed, Patient's Cardiovascular Status Stable, Respiratory Function Stable, Patent Airway, No signs of Nausea or vomiting and Pain level controlled  Post-op Vital Signs: stable  Last Vitals:  Filed Vitals:   04/19/14 1630  BP: 157/48  Pulse: 66  Temp:   Resp: 11    Complications: No apparent anesthesia complications

## 2014-04-19 NOTE — Transfer of Care (Signed)
Immediate Anesthesia Transfer of Care Note  Patient: Austin Ward  Procedure(s) Performed: Procedure(s): LEFT NECK DISSECTION (Left)  Patient Location: PACU  Anesthesia Type:General  Level of Consciousness: awake and alert   Airway & Oxygen Therapy: Patient Spontanous Breathing and Patient connected to nasal cannula oxygen  Post-op Assessment: Report given to PACU RN, Post -op Vital signs reviewed and stable and Patient moving all extremities X 4  Post vital signs: Reviewed and stable  Complications: No apparent anesthesia complications

## 2014-04-19 NOTE — Anesthesia Preprocedure Evaluation (Addendum)
Anesthesia Evaluation  Patient identified by MRN, date of birth, ID band Patient awake    Reviewed: Allergy & Precautions, H&P , NPO status , Patient's Chart, lab work & pertinent test results  Airway Mallampati: III  TM Distance: <3 FB Neck ROM: Limited   Comment: Listed as Grade 4 view for hip surgery Dental no notable dental hx.    Pulmonary neg pulmonary ROS, former smoker,  breath sounds clear to auscultation  Pulmonary exam normal       Cardiovascular hypertension, Pt. on medications + CAD, + CABG and + Peripheral Vascular Disease + Valvular Problems/Murmurs AS Rhythm:Regular Rate:Normal  Left ventricle: The cavity size was normal. Wall thickness was normal. Systolic function was normal. The estimated ejection fraction was in the range of 55% to 60%. Wall motion was normal; there were no regional wall motion abnormalities. Left ventricular diastolic function parameters were normal. - Aortic valve: Cusp separation was mildly reduced. Valve mobility was restricted. There was mild to moderate stenosis. Valve area: 1.41cm^2(VTI). Valve area: 1.52cm^2 (Vmax). - Mitral valve: Calcified annulus. Valve area by pressure half-time: 0.66cm^2. - Left atrium: The atrium was mildly to moderately dilated. - Atrial septum: No defect or patent foramen ovale was identified. 2014   Neuro/Psych negative neurological ROS  negative psych ROS   GI/Hepatic Neg liver ROS, GERD-  Medicated,  Endo/Other  Hyperthyroidism   Renal/GU negative Renal ROS  negative genitourinary   Musculoskeletal negative musculoskeletal ROS (+)   Abdominal   Peds negative pediatric ROS (+)  Hematology negative hematology ROS (+)   Anesthesia Other Findings   Reproductive/Obstetrics negative OB ROS                            Anesthesia Physical Anesthesia Plan  ASA: III  Anesthesia Plan: General    Post-op Pain Management:    Induction: Intravenous  Airway Management Planned: Oral ETT  Additional Equipment: Arterial line  Intra-op Plan:   Post-operative Plan: Extubation in OR  Informed Consent: I have reviewed the patients History and Physical, chart, labs and discussed the procedure including the risks, benefits and alternatives for the proposed anesthesia with the patient or authorized representative who has indicated his/her understanding and acceptance.   Dental advisory given  Plan Discussed with: Surgeon and CRNA  Anesthesia Plan Comments:        Anesthesia Quick Evaluation

## 2014-04-19 NOTE — Op Note (Signed)
OPERATIVE REPORT  DATE OF SURGERY: 04/19/2014  PATIENT:  Austin Ward,  80 y.o. 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  COUNTS:  Correct  PROCEDURE DETAILS: The patient was taken to the operating room and placed on the operating table in the supine position. Following induction of general endotracheal anesthesia the left neck was prepped and draped in a standard fashion. A neck dissection incision was outlined with a marking pen and a portion of it was initially used for approach to the submandibular triangle. Electrocautery was used to incise the skin and subcutaneous tissue and down through the platysma layer. Subplatysmal flap was developed superiorly up towards the mandible. Self-retaining retractor was used throughout the remainder of the case. The marginal mandibular branch of the facial nerve was identified and reflected superiorly. The facial vessels were separately identified, ligated between clamps and divided. The submandibular gland was brought down inferiorly along with the associated hard mass. The mylohyoid muscle was identified and was uncovered. This was then reflected up superiorly exposing the lingual nerve. The lingual nerve was preserved for the ganglion was sacrificed. The submandibular duct and sublingual gland were divided as well. The gland was brought further down revealing the entire digastric muscle. The facial artery was ligated and divided. 4-0 silk ties were used for all vessels. The fibrofatty tissue surrounding the gland was removed with the associated lymph nodes.  The specimen was sent for frozen section analysis with particular attention to the hard 2 cm oval mass adjacent to the submandibular gland. Frozen section analysis revealed findings consistent with sialoadenitis, no evidence of malignancy. A decision was made to not pursue any further dissection. The wound was irrigated with saline. Hemostasis was completed. A drain was exited through separate stab incision and secured in place with nylon suture. The platysma layer was reapproximated with interrupted chromic 3-0 chromic. A 3-0 chromic subcuticular running closure was then accomplished. Dermabond was used on the skin. The drain was charged. The patient was awakened, extubated and transferred to recovery in stable condition.    PATIENT DISPOSITION:  To PACU, stable

## 2014-04-19 NOTE — H&P (View-Only) (Signed)
  Assessment  Cervical lymphadenopathy (785.6) (R59.0). Discussed  No new complaints. On exam, the tongue and floor of mouth are healthy. There is no visible or palpable mass. The right neck is free of any palpable masses. There is a palpable submandibular node on the left. FNA was performed. We will discuss results when available. Reason For Visit  Follow up for floor of the mouth cancer. Allergies  No Known Drug Allergies. Current Meds  Propylthiouracil 50 MG Oral Tablet;; RPT Metoprolol Tartrate 50 MG Oral Tablet;; RPT Triamterene-HCTZ 37.5-25 MG Oral Tablet;; RPT Aspirin Low Dose 81 MG Oral Tablet;; RPT Cartia XT 180 MG/24HR CPCR;; RPT Protonix 40 MG Oral Tablet Delayed Release (Pantoprazole Sodium);; RPT Acetaminophen TABS;; RPT Oracit SOLN;; RPT Klor-Con TBCR;; RPT Crestor TABS;; RPT Finasteride TABS;; RPT Vitamin D3 TABS;; RPT Tamsulosin HCl CAPS;; RPT. 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 PMH  History of malignant neoplasm (V10.90) (Z85.9); Resolved: [id number]. History of tongue cancer (V10.01) (Z85.810); Resolved: [id number]. Family Hx  Family history of cardiac disorder: Mother (V17.49) (Z82.49). Personal Hx  Never smoker No alcohol use No caffeine use Non-smoker (V49.89) (Z78.9). Procedure  FNA The risks and benefits of this procedure have been thoroughly discussed with the patient.  The most commons risks outlined included but were not limited to: injury  to the nasal mucosa or throat irritation.  The patient was further informed that there are other less common risks.  The patient was given the opportunity to ask questions and all such questions were answered to the patient's  satisfaction.  Patient acknowledged the risks and has agreed to proceed.   Preop Diagnosis:  Procedure: Using a 10 cc syringe with a 22 gauge needle, the mass aspirated. This was repeated with a separate needle a second time. Cytology samples were prepared. A dressing was applied. Tolerance: Excellent Unplanned interventions: None  Unplanned events: No complications. Signature  Electronically signed by : Izora Gala  M.D.; 04/09/2014 2:00 PM EST.

## 2014-04-19 NOTE — Anesthesia Procedure Notes (Signed)
Procedure Name: Intubation Date/Time: 04/19/2014 2:06 PM Performed by: Rush Farmer E Pre-anesthesia Checklist: Patient identified, Emergency Drugs available, Suction available, Patient being monitored and Timeout performed Patient Re-evaluated:Patient Re-evaluated prior to inductionOxygen Delivery Method: Circle system utilized Preoxygenation: Pre-oxygenation with 100% oxygen Intubation Type: IV induction Ventilation: Mask ventilation without difficulty Tube type: Oral Tube size: 7.5 mm Number of attempts: 1 Airway Equipment and Method: Stylet and Video-laryngoscopy Placement Confirmation: ETT inserted through vocal cords under direct vision,  positive ETCO2 and breath sounds checked- equal and bilateral Secured at: 22 cm Tube secured with: Tape Dental Injury: Teeth and Oropharynx as per pre-operative assessment  Comments: Electively used Glidescope per Dr. Kalman Shan request. Grade I view on glidescope screen. AOI, +ETCO2 & BBS=.

## 2014-04-19 NOTE — Discharge Instructions (Signed)
It is okay to shower and use soap and water but do not use any creams, oils or ointment on the incision.

## 2014-04-19 NOTE — Progress Notes (Signed)
Complains of left neck soreness.  Otherwise, no complaints. AF VSS Alert NAD. Left neck incision clean and intact, drain functioning, no fluid collection.  Drain 13 cc. CN VII and XI symmetrically intact. A/P: s/p left neck dissection Continue observation, drain in place.  Pain control.

## 2014-04-20 ENCOUNTER — Encounter (HOSPITAL_COMMUNITY): Payer: Self-pay | Admitting: Otolaryngology

## 2014-04-20 DIAGNOSIS — K1123 Chronic sialoadenitis: Secondary | ICD-10-CM | POA: Diagnosis not present

## 2014-04-20 MED ORDER — PNEUMOCOCCAL VAC POLYVALENT 25 MCG/0.5ML IJ INJ: 0.5000 mL | INJECTION | INTRAMUSCULAR | Status: DC

## 2014-04-20 MED ORDER — INFLUENZA VAC SPLIT QUAD 0.5 ML IM SUSY: 0.5000 mL | PREFILLED_SYRINGE | INTRAMUSCULAR | Status: DC

## 2014-04-20 MED ORDER — CITRIC ACID-SODIUM CITRATE 334-500 MG/5ML PO SOLN
15.0000 mL | Freq: Two times a day (BID) | ORAL | Status: DC
  Administered 2014-04-20: 15 mL via ORAL
  Filled 2014-04-20 (×3): qty 15

## 2014-04-20 NOTE — Progress Notes (Signed)
UR completed 

## 2014-04-20 NOTE — Discharge Summary (Signed)
Physician Discharge Summary  Patient ID: Austin Ward MRN: [id number] DOB/AGE: 05-30-1923 80 y.o.  Admit date: 04/19/2014 Discharge date: 04/20/2014  Admission Diagnoses:Neck mass, oral cancer  Discharge Diagnoses:  Active Problems:   Sialoadenitis of submandibular gland   Discharged Condition: good  Hospital Course: no complications  Consults: none  Significant Diagnostic Studies: none  Treatments: surgery: Left submandibular gland excision  Discharge Exam: Blood pressure 132/60, pulse 63, temperature 98.1 F (36.7 C), temperature source Oral, resp. rate 15, height 5\' 9"  (1.753 m), weight 77.225 kg (170 lb 4 oz), SpO2 97 %. PHYSICAL EXAM: Incision excellent, JP removed.   Disposition: 01-Home or Self Care     Medication List    TAKE these medications        acetaminophen 500 MG tablet  Commonly known as:  TYLENOL  Take 1,000 mg by mouth every 6 (six) hours as needed for mild pain, moderate pain or fever.     aspirin EC 81 MG tablet  Take 81 mg by mouth daily.     clindamycin 300 MG capsule  Commonly known as:  CLEOCIN  Take 1 capsule (300 mg total) by mouth 3 (three) times daily.     diltiazem 180 MG 24 hr capsule  Commonly known as:  CARDIZEM CD  Take 180 mg by mouth daily.     finasteride 5 MG tablet  Commonly known as:  PROSCAR  Take 5 mg by mouth daily.     HYDROcodone-acetaminophen 7.5-325 MG per tablet  Commonly known as:  NORCO  Take 1 tablet by mouth every 6 (six) hours as needed for moderate pain.     metoprolol succinate 25 MG 24 hr tablet  Commonly known as:  TOPROL-XL  Take 25 mg by mouth daily.     ORACIT 490-640 MG/5ML Soln  Generic drug:  sod citrate-citric acid  Take 15 mLs by mouth 2 (two) times daily after a meal. Take 3 tablespoons in the morning and 2 tablespoons at night     pantoprazole 40 MG tablet  Commonly known as:  PROTONIX  Take 1 tablet (40 mg total) by mouth daily.     potassium chloride 10 MEQ tablet   Commonly known as:  K-DUR,KLOR-CON  Take 10 mEq by mouth daily.     promethazine 25 MG suppository  Commonly known as:  PHENERGAN  Place 1 suppository (25 mg total) rectally every 6 (six) hours as needed for nausea or vomiting.     propylthiouracil 50 MG tablet  Commonly known as:  PTU  Take 100 mg by mouth 2 (two) times daily.     rosuvastatin 10 MG tablet  Commonly known as:  CRESTOR  Take 10 mg by mouth once a week. Monday     tamsulosin 0.4 MG Caps capsule  Commonly known as:  FLOMAX  Take 0.8 mg by mouth daily. At bedtime     triamcinolone 0.1 % paste  Commonly known as:  KENALOG     triamterene-hydrochlorothiazide 37.5-25 MG per tablet  Commonly known as:  MAXZIDE-25  Take 1 tablet by mouth daily.     Vitamin D3 2000 UNITS Tabs  Take 2,000 Int'l Units by mouth daily.           Follow-up Information    Follow up with Izora Gala, MD. Schedule an appointment as soon as possible for a visit in 1 week.   Specialty:  Otolaryngology   Contact information:   [address] [place] [address] [phone]  SignedIzora Gala 04/20/2014, 12:20 PM

## 2014-04-20 NOTE — Progress Notes (Addendum)
AVS discharge instructions were reviewed with patient. Patient stated that he did not have any questions. Dressing was changed where jp drain was taken out with dry guaze. Will assist patient to his transportation once it gets here.

## 2014-04-20 NOTE — Progress Notes (Signed)
No complaints of pain throughout the night.  Patient did say the surgical site seemed a little sore.  No PRNs given.  Will continue to monitor any changes.

## 2014-04-20 NOTE — Progress Notes (Signed)
Patient asked if he wanted to get the influenza and pneumococcal vaccine and he refused.

## 2014-06-08 ENCOUNTER — Telehealth: Payer: Self-pay | Admitting: Cardiovascular Disease

## 2014-06-08 MED ORDER — ROSUVASTATIN CALCIUM 20 MG PO TABS: 10.0000 mg | ORAL_TABLET | ORAL | Status: AC

## 2014-06-08 NOTE — Telephone Encounter (Signed)
Called pt, explained we do not have the 10mg  dose samples. Hhe explained that since he is prescribed 1 per week [employer] will only allow him 4 pills per refill, he cites cost of $25 per refill as a deterrent to going to pharmacy for it. I noted since he is only taking 1 per week, I could give him a box of the 20mg  dose sample and instructed him to take 1/2 tablet of this strength. Pt voiced understanding, samples left at front desk for pickup.

## 2014-06-08 NOTE — Telephone Encounter (Signed)
Calling to see if he can get some samples of Crestor 10mg  . Mr. Austin Ward has to leave his home at 2:30 and if someone can call him before that to let him know if we have any samples .Marland Kitchen Can leave a message .Marland Kitchen  Thanks

## 2014-06-15 ENCOUNTER — Ambulatory Visit: Payer: Medicare Other | Admitting: Podiatry

## 2014-06-27 ENCOUNTER — Encounter: Payer: Self-pay | Admitting: Podiatry

## 2014-06-27 ENCOUNTER — Ambulatory Visit (INDEPENDENT_AMBULATORY_CARE_PROVIDER_SITE_OTHER): Payer: Medicare Other | Admitting: Podiatry

## 2014-06-27 VITALS — BP 150/65 | HR 64

## 2014-06-27 DIAGNOSIS — B351 Tinea unguium: Secondary | ICD-10-CM | POA: Diagnosis not present

## 2014-06-27 DIAGNOSIS — M79606 Pain in leg, unspecified: Secondary | ICD-10-CM | POA: Diagnosis not present

## 2014-06-27 NOTE — Patient Instructions (Signed)
Seen for hypertrophic nails. All nails debrided. Return in 3 months or as needed.  

## 2014-06-27 NOTE — Progress Notes (Signed)
Subjective:  81 y.o. year old male patient presents complaining of painful nails. Patient requests toe nails trimmed.   Objective: Dermatologic: Thick yellow deformed nails x 10.  Vascular: Pedal pulses are not palpable on both DP and PT bilateral.  Orthopedic: High arched cavus foot with contracted lesser digits x 10.  Neurologic: All epicritic and tactile sensations grossly intact.   Assessment:  Dystrophic mycotic nails x 10.  Painful feet.  PVD.   Treatment: All mycotic nails debrided.  Return in 3 months or as needed

## 2014-07-03 ENCOUNTER — Telehealth (HOSPITAL_COMMUNITY): Payer: Self-pay | Admitting: *Deleted

## 2014-07-12 ENCOUNTER — Other Ambulatory Visit (HOSPITAL_COMMUNITY): Payer: Self-pay | Admitting: Cardiovascular Disease

## 2014-07-12 ENCOUNTER — Ambulatory Visit (HOSPITAL_COMMUNITY)
Admission: RE | Admit: 2014-07-12 | Discharge: 2014-07-12 | Disposition: A | Discharge status: Home / Self Care | Payer: Medicare Other | Source: Ambulatory Visit | Attending: Cardiology | Admitting: Cardiology

## 2014-07-12 DIAGNOSIS — I779 Disorder of arteries and arterioles, unspecified: Secondary | ICD-10-CM

## 2014-07-12 DIAGNOSIS — I6523 Occlusion and stenosis of bilateral carotid arteries: Secondary | ICD-10-CM | POA: Insufficient documentation

## 2014-07-12 NOTE — Progress Notes (Signed)
Carotid Duplex Completed. Arnetta Odeh, BS, RDMS, RVT  

## 2014-07-16 ENCOUNTER — Telehealth: Payer: Self-pay | Admitting: *Deleted

## 2014-07-16 NOTE — Telephone Encounter (Signed)
-----   Message from Sanda Klein, MD sent at 07/14/2014  1:11 PM EST ----- No change in moderate carotid disease compared to last year

## 2014-07-16 NOTE — Telephone Encounter (Signed)
Left message to call back in regards to test results

## 2014-07-17 ENCOUNTER — Telehealth: Payer: Self-pay | Admitting: *Deleted

## 2014-07-17 NOTE — Telephone Encounter (Signed)
-----   Message from Sanda Klein, MD sent at 07/14/2014  1:11 PM EST ----- No change in moderate carotid disease compared to last year

## 2014-07-17 NOTE — Telephone Encounter (Signed)
Spoke to patient. Result given . Verbalized understanding Annual appointment schedule for 10/05/14 2:45 pm

## 2014-09-16 ENCOUNTER — Other Ambulatory Visit: Payer: Self-pay | Admitting: Cardiovascular Disease

## 2014-09-25 ENCOUNTER — Encounter: Payer: Self-pay | Admitting: Podiatry

## 2014-09-25 ENCOUNTER — Ambulatory Visit (INDEPENDENT_AMBULATORY_CARE_PROVIDER_SITE_OTHER): Payer: Medicare Other | Admitting: Podiatry

## 2014-09-25 VITALS — BP 127/51 | HR 63

## 2014-09-25 DIAGNOSIS — M79606 Pain in leg, unspecified: Secondary | ICD-10-CM | POA: Diagnosis not present

## 2014-09-25 DIAGNOSIS — B351 Tinea unguium: Secondary | ICD-10-CM

## 2014-09-25 NOTE — Patient Instructions (Signed)
Seen for hypertrophic nails. All nails debrided. Return in 3 months or as needed.  

## 2014-09-25 NOTE — Progress Notes (Signed)
Subjective:  81 y.o. year old male patient presents complaining of painful nails. Patient requests toe nails trimmed.  No new complaints.   Objective: Dermatologic: Thick yellow deformed nails x 10.  Vascular: Pedal pulses are not palpable on both DP and PT bilateral.  Orthopedic: High arched cavus foot with contracted lesser digits x 10.  Neurologic: All epicritic and tactile sensations grossly intact.   Assessment:  Dystrophic mycotic nails x 10.  Painful feet.  PVD.   Treatment: All mycotic nails debrided.  Return in 3 months or as needed.

## 2014-10-05 ENCOUNTER — Encounter: Payer: Self-pay | Admitting: Cardiovascular Disease

## 2014-10-05 ENCOUNTER — Ambulatory Visit (INDEPENDENT_AMBULATORY_CARE_PROVIDER_SITE_OTHER): Payer: Medicare Other | Admitting: Cardiovascular Disease

## 2014-10-05 VITALS — BP 148/56 | HR 61 | Ht 69.0 in | Wt 161.0 lb

## 2014-10-05 DIAGNOSIS — I6523 Occlusion and stenosis of bilateral carotid arteries: Secondary | ICD-10-CM

## 2014-10-05 DIAGNOSIS — F1921 Other psychoactive substance dependence, in remission: Secondary | ICD-10-CM

## 2014-10-05 DIAGNOSIS — I10 Essential (primary) hypertension: Secondary | ICD-10-CM

## 2014-10-05 DIAGNOSIS — E785 Hyperlipidemia, unspecified: Secondary | ICD-10-CM | POA: Diagnosis not present

## 2014-10-05 DIAGNOSIS — I25810 Atherosclerosis of coronary artery bypass graft(s) without angina pectoris: Secondary | ICD-10-CM

## 2014-10-05 DIAGNOSIS — I350 Nonrheumatic aortic (valve) stenosis: Secondary | ICD-10-CM

## 2014-10-05 DIAGNOSIS — Z79899 Other long term (current) drug therapy: Secondary | ICD-10-CM

## 2014-10-05 NOTE — Patient Instructions (Signed)
Medication Instructions:  Dr Sallyanne Kuster recommends that you TAKE Pepcid or Zantac (over the counter antiacids) IN ADDITION TO PROTONIX.  Labwork: Your physician recommends that you return for lab work at your earliest [place].  Follow-Up: Dr Sallyanne Kuster recommends that you schedule a follow-up appointment in 1 year. You will receive a reminder letter in the mail two months in advance. If you don't receive a letter, please call our office to schedule the follow-up appointment.

## 2014-10-05 NOTE — Progress Notes (Signed)
Patient ID: Austin Ward, male   DOB: [birth year], 81 y.o.   MRN: [id number]     Cardiology Office Note   Date:  10/05/2014   ID:  Austin Ward, DOB 1924-04-07, MRN [id number]  PCP:  [name], MD  Cardiologist:   Sanda Klein, MD   Chief Complaint  Patient presents with  . Annual Exam    patient reports slight shortness of breath and indigestion - currently on Protonix 40, patient reports that is not helping.      History of Present Illness: Austin Ward is a 81 y.o. male who presents for followup of coronary artery disease, carotid artery stenosis, aortic valve stenosis, hypertension and hyperlipidemia.  He has epigastric discomfort associated with eating and has very frequent burping. The discomfort never occurs in association with physical activity. Protonix provided good symptom relief in the past but is no longer working. He denies exertional dyspnea, angina or dizziness/syncope. He does not have edema, palpitations or focal neurological complaints.  It has been almost 21 years since he underwent bypass surgery. He has no cardiovascular complaints. He has not required repeat catheterization or any revascularization procedure since his initial bypass surgery. He had a normal nuclear stress test in 2011. He has normal left ventricular systolic function. An echo performed in March of 2015 showed mild to moderate aortic valve stenosis with a valve area of 1.5 cm square and a mean gradient of 18 mm Hg. He has bilateral carotid disease, more severe on the right where it is 50-70% in severity, stable however by serial studies, most recently March 2016. He takes once weekly Crestor which does a suboptimal job of controlling his LDL cholesterol but he has been intolerant to higher doses or any other statins. Unfortunately, we are no longer able to provide Crestor samples. He has been off this medication now for a couple of weeks.  Past Medical History  Diagnosis Date  .  CAD (coronary artery disease)   . S/P CABG x 4 02/27/1994    LIMA to LAD,SVG to OM,SVG to 2nd OM,SVG to RCA-Dr. Cyndia Ward  . Heart murmur   . Carotid arterial disease     07/04/12 doppler:right ICA 50-69%,left bulb & ICA 0-49%  . Systemic hypertension   . Dyslipidemia   . Bilateral carotid artery stenosis 04/16/2013  . Aortic valve stenosis 04/16/2013    1.5 cm by echo March 2014  . Essential hypertension 04/16/2013    Did not tolerate higher doses of statins. Satisfactory control on once weekly Crestor   . Occasional tremors     mostly right hand  . Enlarged prostate   . GERD (gastroesophageal reflux disease)   . Cancer     oral cancer  . History of shingles 2005  . Hard of hearing   . Hyperthyroidism     Past Surgical History  Procedure Laterality Date  . Coronary artery bypass graft  02/27/1994    LIMA to LAD,SVG to OM,SVG to 2nd OM,SVG to RCA  . Intraocular lens insertion  2000  . Cardiac catheterization  02/25/1994  . US echocardiography  07/04/2012    mild to mod AOV stenosis,mitral annular ca+,LA mildly to mod. dilated  . Nm myoview ltd  04/01/2010    no ischemia, EF 60%  . Eye surgery Bilateral     cataract surgery  . Tonsillectomy    . Colonoscopy    . Mouth biopsy  12/13/2013    DR ROSEN  . Floor of mouth biopsy N/A 12/13/2013    Procedure:  EXCISION FLOOR OF MOUTH TUMOR/SUBMANDIBULAR DUCT REROUTING/EXCISIONAL BIOPSY OF LYMPHNODE;  Surgeon: Izora Gala, MD;  Location: [facility] [facility];  Service: ENT;  Laterality: N/A;  . Radical neck dissection Left 04/19/2014    Procedure: LEFT NECK DISSECTION;  Surgeon: Izora Gala, MD;  Location: [place];  Service: ENT;  Laterality: Left;     Current Outpatient Prescriptions  Medication Sig Dispense Refill  . acetaminophen (TYLENOL) 500 MG tablet Take 1,000 mg by mouth every 6 (six) hours as needed for mild pain, moderate pain or fever.     Marland Kitchen aspirin EC 81 MG tablet Take 81 mg by mouth daily.    . Cholecalciferol (VITAMIN D3) 2000 UNITS  TABS Take 2,000 Int'l Units by mouth daily.    Marland Kitchen diltiazem (CARDIZEM CD) 180 MG 24 hr capsule Take 180 mg by mouth daily.    . finasteride (PROSCAR) 5 MG tablet Take 5 mg by mouth daily.    Marland Kitchen HYDROcodone-acetaminophen (NORCO) 7.5-325 MG per tablet Take 1 tablet by mouth every 6 (six) hours as needed for moderate pain. 30 tablet 0  . metoprolol succinate (TOPROL-XL) 25 MG 24 hr tablet Take 25 mg by mouth daily.    . pantoprazole (PROTONIX) 40 MG tablet TAKE 1 TABLET BY MOUTH DAILY 30 tablet 0  . potassium chloride (K-DUR,KLOR-CON) 10 MEQ tablet Take 10 mEq by mouth daily.    Marland Kitchen propylthiouracil (PTU) 50 MG tablet Take 100 mg by mouth 2 (two) times daily.     . rosuvastatin (CRESTOR) 20 MG tablet Take 0.5 tablets (10 mg total) by mouth once a week. Monday 7 tablet 0  . sod citrate-citric acid (ORACIT) 490-640 MG/5ML SOLN Take 15 mLs by mouth 2 (two) times daily after a meal. Take 3 tablespoons in the morning and 2 tablespoons at night    . tamsulosin (FLOMAX) 0.4 MG CAPS Take 0.8 mg by mouth daily. At bedtime    . triamterene-hydrochlorothiazide (MAXZIDE-25) 37.5-25 MG per tablet Take 1 tablet by mouth daily.     No current facility-administered medications for this visit.    Allergies:   Ace inhibitors and Lipitor    Social History:  The patient  reports that he quit smoking about 43 years ago. He has never used smokeless tobacco. He reports that he does not drink alcohol or use illicit drugs.   Family History:  The patient's family history includes CAD in his mother; Hypertension in his mother.    ROS:  Please see the history of present illness.    Otherwise, review of systems positive for reflux symptoms.   All other systems are reviewed and negative.    PHYSICAL EXAM: VS:  BP 148/56 mmHg  Pulse 61  Ht '5\' 9"'$  (1.753 m)  Wt 73.029 kg (161 lb)  BMI 23.76 kg/m2 , BMI Body mass index is 23.76 kg/(m^2).  General: Alert, oriented x3, no distress, very hard of hearing Head: no evidence  of trauma, PERRL, EOMI, no exophtalmos or lid lag, no myxedema, no xanthelasma; normal ears, nose and oropharynx Neck: normal jugular venous pulsations and no hepatojugular reflux; brisk carotid pulses without delay and bilateral carotid bruits Chest: clear to auscultation, no signs of consolidation by percussion or palpation, normal fremitus, symmetrical and full respiratory excursions Cardiovascular: normal position and quality of the apical impulse, regular rhythm, normal first and widely split second heart sounds, grade 2/6 early peaking aortic ejection murmur, no diastolic murmurs, rubs or gallops Abdomen: no tenderness or distention, no masses by palpation, no abnormal pulsatility or  arterial bruits, normal bowel sounds, no hepatosplenomegaly Extremities: no clubbing, cyanosis or edema; 2+ radial, ulnar and brachial pulses bilaterally; 2+ right femoral, posterior tibial and dorsalis pedis pulses; 2+ left femoral, posterior tibial and dorsalis pedis pulses; no subclavian or femoral bruits Neurological: grossly nonfocal Psych: euthymic mood, full affect   EKG:  EKG is ordered today. The ekg ordered today demonstrates NSR, old RBBB   Recent Labs: 04/19/2014: BUN 30*; Creatinine 1.20; Hemoglobin 12.7*; Platelets 210; Potassium 3.9; Sodium 141    Lipid Panel    Component Value Date/Time   CHOL 131 04/19/2013 1027   TRIG 119 04/19/2013 1027   HDL 32* 04/19/2013 1027   CHOLHDL 4.1 04/19/2013 1027   VLDL 24 04/19/2013 1027   LDLCALC 75 04/19/2013 1027      Wt Readings from Last 3 Encounters:  10/05/14 73.029 kg (161 lb)  04/19/14 77.225 kg (170 lb 4 oz)  12/13/13 77.3 kg (170 lb 6.7 oz)      ASSESSMENT AND PLAN:  Gastroesophageal reflux disease His epigastric discomfort responded well to pantoprazole 40 mg daily, but his symptoms have recurred when he switched to over-the-counter omeprazole. Even pantoprazole not fully effective anymore, would advise that he add over-the-counter  H2 blockers such as Zantac or Pepcid.  Aortic valve stenosis  Asymptomatic mild to moderate aortic stenosis, repeat echo if he develops symptoms of chest pain with exertion, dyspnea on exertion or dizziness/syncope with exertion. I think because of his age and history of previous sternotomy he would not be a good candidate for aortic valve replacement with conventional surgery, but he may benefit from TAVR for symptomatic aortic stenosis in the future.  Bilateral carotid artery stenosis  Asymptomatic. Reevaluate in one year.   CAD s/p CABG 1995  Bypass surgery provided a remarkably durable benefit. He is asymptomatic. He has normal left ventricular systolic function.   Essential hypertension  Good control. Note that he is taking both a beta blocker and a centrally acting calcium channel blocker and has right bundle branch block. Low threshold to discontinue the diltiazem should he develop symptoms of dizziness or syncope.   Dyslipidemia  Despite a very low dose of statin (Crestor 10 mg weekly) his lipid profile was actually pretty good. He has been off all medicines for lipid-lowering now for a couple of weeks. Will reevaluate before deciding whether to continue the same regimen, or whether to switch to a low dose of daily pravastatin  Current medicines are reviewed at length with the patient today.  The patient does not have concerns regarding medicines.  The following changes have been made:  And over-the-counter Zantac/Pepcid/axid or similar H2 blocker (but not Tagamet) to his Protonix  Labs/ tests ordered today include:  Orders Placed This Encounter  Procedures  . Comprehensive metabolic panel  . Lipid panel  . EKG 12-Lead   Patient Instructions  Medication Instructions:  Dr Sallyanne Kuster recommends that you TAKE Pepcid or Zantac (over the counter antiacids) IN ADDITION TO PROTONIX.  Labwork: Your physician recommends that you return for lab work at your earliest [place].  Follow-Up: Dr Sallyanne Kuster recommends that you schedule a follow-up appointment in 1 year. You will receive a reminder letter in the mail two months in advance. If you don't receive a letter, please call our office to schedule the follow-up appointment.    Mikael Spray, MD  10/05/2014 3:35 PM    Sanda Klein, MD, [facility] [phone] office [phone] pager

## 2014-10-07 ENCOUNTER — Other Ambulatory Visit: Payer: Self-pay | Admitting: Cardiovascular Disease

## 2014-10-08 ENCOUNTER — Other Ambulatory Visit: Payer: Self-pay | Admitting: *Deleted

## 2014-10-08 MED ORDER — DILTIAZEM HCL ER COATED BEADS 180 MG PO CP24: 180.0000 mg | ORAL_CAPSULE | Freq: Every day | ORAL | Status: DC

## 2014-10-08 NOTE — Telephone Encounter (Signed)
Rx has been sent to the pharmacy electronically. ° °

## 2014-10-10 LAB — COMPREHENSIVE METABOLIC PANEL
ALBUMIN: 3.7 g/dL (ref 3.5–5.2)
ALT: 8 U/L (ref 0–53)
AST: 15 U/L (ref 0–37)
Alkaline Phosphatase: 53 U/L (ref 39–117)
BUN: 27 mg/dL — ABNORMAL HIGH (ref 6–23)
CO2: 28 mEq/L (ref 19–32)
Calcium: 9.4 mg/dL (ref 8.4–10.5)
Chloride: 100 mEq/L (ref 96–112)
Creat: 1.2 mg/dL (ref 0.50–1.35)
Glucose, Bld: 94 mg/dL (ref 70–99)
Potassium: 3.7 mEq/L (ref 3.5–5.3)
Sodium: 139 mEq/L (ref 135–145)
Total Bilirubin: 0.6 mg/dL (ref 0.2–1.2)
Total Protein: 6.5 g/dL (ref 6.0–8.3)

## 2014-10-10 LAB — LIPID PANEL
Cholesterol: 156 mg/dL (ref 0–200)
HDL: 32 mg/dL — ABNORMAL LOW (ref >=40)
LDL CALC: 94 mg/dL (ref 0–99)
Total CHOL/HDL Ratio: 4.9 Ratio
Triglycerides: 151 mg/dL — ABNORMAL HIGH (ref <150)
VLDL: 30 mg/dL (ref 0–40)

## 2014-10-12 ENCOUNTER — Telehealth: Payer: Self-pay | Admitting: Cardiovascular Disease

## 2014-10-12 ENCOUNTER — Telehealth: Payer: Self-pay | Admitting: *Deleted

## 2014-10-12 DIAGNOSIS — Z79899 Other long term (current) drug therapy: Secondary | ICD-10-CM

## 2014-10-12 DIAGNOSIS — E785 Hyperlipidemia, unspecified: Secondary | ICD-10-CM

## 2014-10-12 NOTE — Telephone Encounter (Signed)
Patient received message on lab results.  States he has been out of crestor '10mg'$  x 2-3 weeks prior to getting his labs done.  Asking should he now take 2 x weekly or go back on same dose?  Will send info to Dr. Loletha Grayer for review and advise before sending in a new Rx.  Patient voiced understanding.

## 2014-10-12 NOTE — Telephone Encounter (Signed)
Spoke to [facility] Pharmacist Patient requesting  Potassium ER instead plain potassium Okay to change medication to ER Dosing.

## 2014-10-14 NOTE — Telephone Encounter (Signed)
Restart previous dose and lets recheck in 3 months

## 2014-10-16 ENCOUNTER — Other Ambulatory Visit: Payer: Self-pay | Admitting: Cardiovascular Disease

## 2014-10-16 NOTE — Telephone Encounter (Signed)
Patient will restart Crestor '10mg'$  weekly Monday.

## 2014-10-16 NOTE — Telephone Encounter (Signed)
Rx(s) sent to pharmacy electronically.  

## 2014-10-22 ENCOUNTER — Inpatient Hospital Stay (HOSPITAL_COMMUNITY)
Admission: EM | Admit: 2014-10-22 | Discharge: 2014-10-28 | DRG: 181 | Disposition: A | Discharge status: Skilled Nursing Facility | Payer: Medicare Other | Attending: Internal Medicine | Admitting: Internal Medicine

## 2014-10-22 ENCOUNTER — Encounter (HOSPITAL_COMMUNITY): Payer: Self-pay | Admitting: *Deleted

## 2014-10-22 ENCOUNTER — Emergency Department (HOSPITAL_COMMUNITY): Payer: Medicare Other

## 2014-10-22 DIAGNOSIS — I10 Essential (primary) hypertension: Secondary | ICD-10-CM | POA: Diagnosis not present

## 2014-10-22 DIAGNOSIS — N179 Acute kidney failure, unspecified: Secondary | ICD-10-CM

## 2014-10-22 DIAGNOSIS — K219 Gastro-esophageal reflux disease without esophagitis: Secondary | ICD-10-CM | POA: Diagnosis present

## 2014-10-22 DIAGNOSIS — E0590 Thyrotoxicosis, unspecified without thyrotoxic crisis or storm: Secondary | ICD-10-CM | POA: Diagnosis present

## 2014-10-22 DIAGNOSIS — Z87891 Personal history of nicotine dependence: Secondary | ICD-10-CM

## 2014-10-22 DIAGNOSIS — Z8249 Family history of ischemic heart disease and other diseases of the circulatory system: Secondary | ICD-10-CM

## 2014-10-22 DIAGNOSIS — E1165 Type 2 diabetes mellitus with hyperglycemia: Secondary | ICD-10-CM | POA: Diagnosis present

## 2014-10-22 DIAGNOSIS — C049 Malignant neoplasm of floor of mouth, unspecified: Secondary | ICD-10-CM | POA: Diagnosis present

## 2014-10-22 DIAGNOSIS — E876 Hypokalemia: Secondary | ICD-10-CM | POA: Diagnosis present

## 2014-10-22 DIAGNOSIS — Z79899 Other long term (current) drug therapy: Secondary | ICD-10-CM

## 2014-10-22 DIAGNOSIS — H9190 Unspecified hearing loss, unspecified ear: Secondary | ICD-10-CM | POA: Diagnosis present

## 2014-10-22 DIAGNOSIS — Z951 Presence of aortocoronary bypass graft: Secondary | ICD-10-CM

## 2014-10-22 DIAGNOSIS — C069 Malignant neoplasm of mouth, unspecified: Secondary | ICD-10-CM

## 2014-10-22 DIAGNOSIS — Z85818 Personal history of malignant neoplasm of other sites of lip, oral cavity, and pharynx: Secondary | ICD-10-CM

## 2014-10-22 DIAGNOSIS — E785 Hyperlipidemia, unspecified: Secondary | ICD-10-CM | POA: Diagnosis not present

## 2014-10-22 DIAGNOSIS — R627 Adult failure to thrive: Secondary | ICD-10-CM | POA: Diagnosis present

## 2014-10-22 DIAGNOSIS — J95811 Postprocedural pneumothorax: Secondary | ICD-10-CM

## 2014-10-22 DIAGNOSIS — C3431 Malignant neoplasm of lower lobe, right bronchus or lung: Principal | ICD-10-CM | POA: Diagnosis present

## 2014-10-22 DIAGNOSIS — Z888 Allergy status to other drugs, medicaments and biological substances status: Secondary | ICD-10-CM

## 2014-10-22 DIAGNOSIS — R05 Cough: Secondary | ICD-10-CM | POA: Diagnosis not present

## 2014-10-22 DIAGNOSIS — N400 Enlarged prostate without lower urinary tract symptoms: Secondary | ICD-10-CM | POA: Diagnosis present

## 2014-10-22 DIAGNOSIS — I6523 Occlusion and stenosis of bilateral carotid arteries: Secondary | ICD-10-CM | POA: Diagnosis present

## 2014-10-22 DIAGNOSIS — R059 Cough, unspecified: Secondary | ICD-10-CM

## 2014-10-22 DIAGNOSIS — Z791 Long term (current) use of non-steroidal anti-inflammatories (NSAID): Secondary | ICD-10-CM

## 2014-10-22 DIAGNOSIS — I2510 Atherosclerotic heart disease of native coronary artery without angina pectoris: Secondary | ICD-10-CM | POA: Diagnosis present

## 2014-10-22 DIAGNOSIS — I350 Nonrheumatic aortic (valve) stenosis: Secondary | ICD-10-CM | POA: Diagnosis present

## 2014-10-22 DIAGNOSIS — E440 Moderate protein-calorie malnutrition: Secondary | ICD-10-CM | POA: Insufficient documentation

## 2014-10-22 DIAGNOSIS — Z7982 Long term (current) use of aspirin: Secondary | ICD-10-CM

## 2014-10-22 DIAGNOSIS — R634 Abnormal weight loss: Secondary | ICD-10-CM

## 2014-10-22 DIAGNOSIS — R410 Disorientation, unspecified: Secondary | ICD-10-CM

## 2014-10-22 DIAGNOSIS — R918 Other nonspecific abnormal finding of lung field: Secondary | ICD-10-CM

## 2014-10-22 HISTORY — DX: Heart failure, unspecified: I50.9

## 2014-10-22 LAB — CBC WITH DIFFERENTIAL/PLATELET
BASOS ABS: 0 10*3/uL (ref 0.0–0.1)
BASOS PCT: 0 % (ref 0–1)
EOS ABS: 0 10*3/uL (ref 0.0–0.7)
Eosinophils Relative: 0 % (ref 0–5)
HEMATOCRIT: 43.3 % (ref 39.0–52.0)
Hemoglobin: 15.1 g/dL (ref 13.0–17.0)
LYMPHS ABS: 0.8 10*3/uL (ref 0.7–4.0)
Lymphocytes Relative: 10 % — ABNORMAL LOW (ref 12–46)
MCH: 30 pg (ref 26.0–34.0)
MCHC: 34.9 g/dL (ref 30.0–36.0)
MCV: 85.9 fL (ref 78.0–100.0)
Monocytes Absolute: 0.7 10*3/uL (ref 0.1–1.0)
Monocytes Relative: 8 % (ref 3–12)
NEUTROS PCT: 82 % — AB (ref 43–77)
Neutro Abs: 7 10*3/uL (ref 1.7–7.7)
Platelets: 225 10*3/uL (ref 150–400)
RBC: 5.04 MIL/uL (ref 4.22–5.81)
RDW: 13.8 % (ref 11.5–15.5)
WBC: 8.5 10*3/uL (ref 4.0–10.5)

## 2014-10-22 LAB — COMPREHENSIVE METABOLIC PANEL
ALT: 10 U/L — AB (ref 17–63)
AST: 18 U/L (ref 15–41)
Albumin: 3.4 g/dL — ABNORMAL LOW (ref 3.5–5.0)
Alkaline Phosphatase: 55 U/L (ref 38–126)
Anion gap: 12 (ref 5–15)
BUN: 33 mg/dL — ABNORMAL HIGH (ref 6–20)
CO2: 24 mmol/L (ref 22–32)
Calcium: 9.1 mg/dL (ref 8.9–10.3)
Chloride: 104 mmol/L (ref 101–111)
Creatinine, Ser: 1.37 mg/dL — ABNORMAL HIGH (ref 0.61–1.24)
GFR, EST AFRICAN AMERICAN: 51 mL/min — AB (ref >60)
GFR, EST NON AFRICAN AMERICAN: 44 mL/min — AB (ref >60)
GLUCOSE: 124 mg/dL — AB (ref 65–99)
POTASSIUM: 3.6 mmol/L (ref 3.5–5.1)
Sodium: 140 mmol/L (ref 135–145)
Total Bilirubin: 1.3 mg/dL — ABNORMAL HIGH (ref 0.3–1.2)
Total Protein: 6.3 g/dL — ABNORMAL LOW (ref 6.5–8.1)

## 2014-10-22 LAB — URINE MICROSCOPIC-ADD ON

## 2014-10-22 LAB — URINALYSIS, ROUTINE W REFLEX MICROSCOPIC
Glucose, UA: NEGATIVE mg/dL
Ketones, ur: 15 mg/dL — AB
LEUKOCYTES UA: NEGATIVE
Nitrite: NEGATIVE
PH: 5.5 (ref 5.0–8.0)
Protein, ur: NEGATIVE mg/dL
SPECIFIC GRAVITY, URINE: 1.019 (ref 1.005–1.030)
Urobilinogen, UA: 1 mg/dL (ref 0.0–1.0)

## 2014-10-22 LAB — BRAIN NATRIURETIC PEPTIDE: B NATRIURETIC PEPTIDE 5: 315.7 pg/mL — AB (ref 0.0–100.0)

## 2014-10-22 LAB — I-STAT CG4 LACTIC ACID, ED: Lactic Acid, Venous: 1.27 mmol/L (ref 0.5–2.0)

## 2014-10-22 MED ORDER — IOHEXOL 300 MG/ML  SOLN
75.0000 mL | Freq: Once | INTRAMUSCULAR | Status: AC | PRN
  Administered 2014-10-22: 75 mL via INTRAVENOUS

## 2014-10-22 NOTE — ED Notes (Signed)
Patient transported to CT 

## 2014-10-22 NOTE — H&P (Signed)
[facility] History and Physical  Austin Ward DOB: February 18, 1924 DOA: 10/22/2014  Referring physician: Lacretia Leigh, MD PCP: [name], MD   Chief Complaint: Cough   HPI: Austin Ward is a 81 y.o. male with history of oral cancer HTN Hyperlipidemia GERD presents with cough and an abnormal CT scan. Patient states that he had been having increased fatigue and has been doing a lot of sleeping. He states that he lost track of two days so he decided to come into the hospital. Patient had been having a cough. Patient stats that he has had some sputum no hemoptysis noted. He has had no fever noted. Patient states that he has no chest pain. In the ED he had a CT scan done and this shows presence of metastatic disease possibly in the lungs.   Review of Systems:  Constitutional:  No weight loss, night sweats, Fevers.  HEENT:  No headaches, sneezing, itching, ear ache, nasal congestion, post nasal drip,  Cardio-vascular:  No chest pain, Orthopnea, PND, swelling in lower extremities GI:  No heartburn, indigestion, abdominal pain, nausea, vomiting, diarrhea  Resp:  No shortness of breath with exertion or at rest. +productive cough,No coughing up of blood  Skin:  no rash or lesions.  GU:  no dysuria, change in color of urine, no urgency or frequency Musculoskeletal:  No joint pain or swelling. No decreased range of motion Psych:  No change in mood or affect. No depression or anxiety  Past Medical History  Diagnosis Date  . CAD (coronary artery disease)   . S/P CABG x 4 02/27/1994    LIMA to LAD,SVG to OM,SVG to 2nd OM,SVG to RCA-Dr. Cyndia Bent  . Heart murmur   . Carotid arterial disease     07/04/12 doppler:right ICA 50-69%,left bulb & ICA 0-49%  . Systemic hypertension   . Dyslipidemia   . Bilateral carotid artery stenosis 04/16/2013  . Aortic valve stenosis 04/16/2013    1.5 cm by echo March 2014  . Essential hypertension 04/16/2013    Did not  tolerate higher doses of statins. Satisfactory control on once weekly Crestor   . Occasional tremors     mostly right hand  . Enlarged prostate   . GERD (gastroesophageal reflux disease)   . Cancer     oral cancer  . History of shingles 2005  . Hard of hearing   . Hyperthyroidism   . CHF (congestive heart failure)    Past Surgical History  Procedure Laterality Date  . Coronary artery bypass graft  02/27/1994    LIMA to LAD,SVG to OM,SVG to 2nd OM,SVG to RCA  . Intraocular lens insertion  2000  . Cardiac catheterization  02/25/1994  . US echocardiography  07/04/2012    mild to mod AOV stenosis,mitral annular ca+,LA mildly to mod. dilated  . Nm myoview ltd  04/01/2010    no ischemia, EF 60%  . Eye surgery Bilateral     cataract surgery  . Tonsillectomy    . Colonoscopy    . Mouth biopsy  12/13/2013    DR ROSEN  . Floor of mouth biopsy N/A 12/13/2013    Procedure: EXCISION FLOOR OF MOUTH TUMOR/SUBMANDIBULAR DUCT REROUTING/EXCISIONAL BIOPSY OF LYMPHNODE;  Surgeon: Izora Gala, MD;  Location: [place];  Service: ENT;  Laterality: N/A;  . Radical neck dissection Left 04/19/2014    Procedure: LEFT NECK DISSECTION;  Surgeon: Izora Gala, MD;  Location: [place];  Service: ENT;  Laterality: Left;   Social History:  reports that he  quit smoking about 43 years ago. He has never used smokeless tobacco. He reports that he does not drink alcohol or use illicit drugs.  Allergies  Allergen Reactions  . Ace Inhibitors Other (See Comments)    Intolerance. Reaction unknown.  . Lipitor [Atorvastatin] Other (See Comments)    Leg weakness    Family History  Problem Relation Age of Onset  . CAD Mother   . Hypertension Mother      Prior to Admission medications   Medication Sig Start Date End Date Taking? Authorizing Provider  acetaminophen (TYLENOL) 500 MG tablet Take 1,000 mg by mouth daily.    Yes Historical Provider, MD  Cholecalciferol (VITAMIN D3) 2000 UNITS TABS Take 2,000 Int'l Units by  mouth daily.   Yes Historical Provider, MD  diltiazem (CARDIZEM CD) 180 MG 24 hr capsule Take 1 capsule (180 mg total) by mouth daily. 10/08/14  Yes Mihai Croitoru, MD  finasteride (PROSCAR) 5 MG tablet Take 5 mg by mouth daily.   Yes Historical Provider, MD  KLOR-CON 10 10 MEQ tablet TAKE 1 TABLET BY MOUTH EVERY DAY 10/08/14  Yes Mihai Croitoru, MD  metoprolol succinate (TOPROL-XL) 25 MG 24 hr tablet TAKE 1 TABLET BY MOUTH DAILY 10/08/14  Yes Mihai Croitoru, MD  pantoprazole (PROTONIX) 40 MG tablet TAKE 1 TABLET BY MOUTH DAILY 10/16/14  Yes Mihai Croitoru, MD  propylthiouracil (PTU) 50 MG tablet Take 100 mg by mouth 2 (two) times daily.  09/06/12  Yes Historical Provider, MD  rosuvastatin (CRESTOR) 20 MG tablet Take 0.5 tablets (10 mg total) by mouth once a week. Monday 06/08/14  Yes Mihai Croitoru, MD  sod citrate-citric acid (ORACIT) 490-640 MG/5ML SOLN Take 15 mLs by mouth 2 (two) times daily after a meal. Take 3 tablespoons in the morning and 2 tablespoons at night   Yes Historical Provider, MD  tamsulosin (FLOMAX) 0.4 MG CAPS Take 0.4 mg by mouth at bedtime. At bedtime   Yes Historical Provider, MD  triamterene-hydrochlorothiazide (MAXZIDE-25) 37.5-25 MG per tablet TAKE 1 TABLET BY MOUTH DAILY 10/08/14  Yes Mihai Croitoru, MD  HYDROcodone-acetaminophen (NORCO) 7.5-325 MG per tablet Take 1 tablet by mouth every 6 (six) hours as needed for moderate pain. Patient not taking: Reported on 10/22/2014 12/13/13   Izora Gala, MD   Physical Exam: Filed Vitals:   10/22/14 2115 10/22/14 2130 10/22/14 2136 10/22/14 2351  BP: 137/69 130/78  154/78  Pulse: 82 85  96  Temp:   99.5 F (37.5 C)   TempSrc:   Rectal   Resp: 16   22  Height:      Weight:      SpO2: 94% 94%  95%    Wt Readings from Last 3 Encounters:  10/22/14 73.483 kg (162 lb)  10/05/14 73.029 kg (161 lb)  04/19/14 77.225 kg (170 lb 4 oz)    General:  Appears calm and comfortable Eyes: PERRL, normal lids, irises & conjunctiva ENT: grossly  hard of hearing Neck: no LAD, thyromegaly Cardiovascular: RRR, no m/r/g. No LE edema. Respiratory: CTA bilaterally, no w/r/r Abdomen: soft, ntnd Skin: no rash or induration seen on limited exam Musculoskeletal: grossly normal tone BUE/BLE Psychiatric: grossly normal mood and affect Neurologic: grossly non-focal.          Labs on Admission:  Basic Metabolic Panel:  Recent Labs Lab 10/22/14 2018  NA 140  K 3.6  CL 104  CO2 24  GLUCOSE 124*  BUN 33*  CREATININE 1.37*  CALCIUM 9.1   Liver Function  Tests:  Recent Labs Lab 10/22/14 2018  AST 18  ALT 10*  ALKPHOS 55  BILITOT 1.3*  PROT 6.3*  ALBUMIN 3.4*   No results for input(s): LIPASE, AMYLASE in the last 168 hours. No results for input(s): AMMONIA in the last 168 hours. CBC:  Recent Labs Lab 10/22/14 2018  WBC 8.5  NEUTROABS 7.0  HGB 15.1  HCT 43.3  MCV 85.9  PLT 225   Cardiac Enzymes: No results for input(s): CKTOTAL, CKMB, CKMBINDEX, TROPONINI in the last 168 hours.  BNP (last 3 results)  Recent Labs  10/22/14 2018  BNP 315.7*    ProBNP (last 3 results) No results for input(s): PROBNP in the last 8760 hours.  CBG: No results for input(s): GLUCAP in the last 168 hours.  Radiological Exams on Admission: Dg Chest 2 View  10/22/2014   CLINICAL DATA:  Cough.  EXAM: CHEST  2 VIEW  COMPARISON:  07/21/2013  FINDINGS: New rounded mass density noted in the right lower lung measuring 4.3 cm. Concern for lung cancer. Left lung scratch head no confluent opacity on the left. Prior CABG. Heart is normal size. No effusions. No acute bony abnormality.  IMPRESSION: 4.3 cm right to lower lobe rounded masslike area concerning for malignancy.   Electronically Signed   By: Rolm Baptise M.D.   On: 10/22/2014 19:07   Ct Chest W Contrast  10/22/2014   CLINICAL DATA:  Patient with productive cough and white sputum. Lymph  EXAM: CT CHEST WITH CONTRAST  TECHNIQUE: Multidetector CT imaging of the chest was performed  during intravenous contrast administration.  CONTRAST:  38m OMNIPAQUE IOHEXOL 300 MG/ML  SOLN  COMPARISON:  PET-CT 11/17/2013  FINDINGS: Mediastinum/Nodes: The thyroid gland is markedly enlarged and contains multiple low-attenuation nodules. Heart is normal in size. No pericardial effusion. Coronary arterial vascular calcifications. No enlarged axillary, mediastinal or hilar lymphadenopathy.  Lungs/Pleura: The central airways are patent. Interval development of a 5.3 x 3.2 cm right lower lobe subpleural mass (image 36; series 3) with central lucency. Minimal dependent atelectasis left lower lobe. Additionally re- demonstrated is a stable 6 mm nodule within the left lower lobe. Re- demonstrated 4 mm predominately ground-glass right lower lobe nodule (image 37; series 3). No pleural effusion or pneumothorax.  Upper abdomen: Stable subcapsular low-attenuation lesion within the liver, right hepatic lobe. Multiple gallstones within the gallbladder lumen. No surrounding inflammatory stranding. Pancreatic atrophy.  Musculoskeletal: Thoracic spine degenerative changes. No aggressive or acute appearing osseous lesions.  IMPRESSION: Interval development of a 5.3 cm subpleural right lower lobe mass with central lucency concerning for neoplastic process, potentially metastatic disease in the setting of known head and neck carcinoma.  Cholelithiasis without CT evidence for acute cholecystitis.   Electronically Signed   By: DLovey NewcomerM.D.   On: 10/22/2014 22:53      Assessment/Plan Active Problems:   Essential hypertension   Dyslipidemia   GERD (gastroesophageal reflux disease)   AKI (acute kidney injury)   Oral cancer   1. Oral Cancer with possible mets to the lung -he now has possible mets to the lungs -will need diagnostic tissue biopsy -will need Oncology consultation  2. Hypertension -monitor pressures will continue with lopressor and cardizem -hold diuretics due to elevated  creatinine  3. Hyperlipidemia -will continue with statins  4. GERD -on PPI  5. AKI -will hydrate -monitor labs  6. Hyperglycemia -will monitor FSBS -SSI as needed  7. Hypothyroid -will check TSH   Code Status: Full Code (must indicate  code status--if unknown or must be presumed, indicate so) DVT Prophylaxis:Heparin Family Communication: None (indicate person spoken with, if applicable, with phone number if by telephone) Disposition Plan: Home (indicate anticipated LOS)  Time spent: 5mn  KHAN,SAADAT A [facility] Pager [phone]

## 2014-10-22 NOTE — ED Provider Notes (Signed)
CSN: [id number]     Arrival date & time 10/22/14  1830 History   First MD Initiated Contact with Patient 10/22/14 1856     Chief Complaint  Patient presents with  . Cough     (Consider location/radiation/quality/duration/timing/severity/associated sxs/prior Treatment) Patient is a 81 y.o. male presenting with cough. The history is provided by the patient.  Cough Cough characteristics:  Productive Sputum characteristics:  Nondescript Severity:  Moderate Onset quality:  Unable to specify Duration:  3 days Timing:  Constant Progression:  Worsening Chronicity:  New Smoker: no   Relieved by:  Nothing Worsened by:  Nothing tried Ineffective treatments:  None tried Associated symptoms: no chills, no fever, no rash, no shortness of breath and no wheezing     Past Medical History  Diagnosis Date  . CAD (coronary artery disease)   . S/P CABG x 4 02/27/1994    LIMA to LAD,SVG to OM,SVG to 2nd OM,SVG to RCA-Dr. Cyndia Bent  . Heart murmur   . Carotid arterial disease     07/04/12 doppler:right ICA 50-69%,left bulb & ICA 0-49%  . Systemic hypertension   . Dyslipidemia   . Bilateral carotid artery stenosis 04/16/2013  . Aortic valve stenosis 04/16/2013    1.5 cm by echo March 2014  . Essential hypertension 04/16/2013    Did not tolerate higher doses of statins. Satisfactory control on once weekly Crestor   . Occasional tremors     mostly right hand  . Enlarged prostate   . GERD (gastroesophageal reflux disease)   . Cancer     oral cancer  . History of shingles 2005  . Hard of hearing   . Hyperthyroidism    Past Surgical History  Procedure Laterality Date  . Coronary artery bypass graft  02/27/1994    LIMA to LAD,SVG to OM,SVG to 2nd OM,SVG to RCA  . Intraocular lens insertion  2000  . Cardiac catheterization  02/25/1994  . US echocardiography  07/04/2012    mild to mod AOV stenosis,mitral annular ca+,LA mildly to mod. dilated  . Nm myoview ltd  04/01/2010    no ischemia, EF 60%   . Eye surgery Bilateral     cataract surgery  . Tonsillectomy    . Colonoscopy    . Mouth biopsy  12/13/2013    DR ROSEN  . Floor of mouth biopsy N/A 12/13/2013    Procedure: EXCISION FLOOR OF MOUTH TUMOR/SUBMANDIBULAR DUCT REROUTING/EXCISIONAL BIOPSY OF LYMPHNODE;  Surgeon: Izora Gala, MD;  Location: [facility];  Service: ENT;  Laterality: N/A;  . Radical neck dissection Left 04/19/2014    Procedure: LEFT NECK DISSECTION;  Surgeon: Izora Gala, MD;  Location[facility];  Service: ENT;  Laterality: Left;   Family History  Problem Relation Age of Onset  . CAD Mother   . Hypertension Mother    History  Substance Use Topics  . Smoking status: Former Smoker    Quit date: 05/04/1971  . Smokeless tobacco: Never Used  . Alcohol Use: No    Review of Systems  Constitutional: Negative for fever and chills.  Respiratory: Positive for cough. Negative for shortness of breath and wheezing.   Skin: Negative for rash.  All other systems reviewed and are negative.     Allergies  Ace inhibitors and Lipitor  Home Medications   Prior to Admission medications   Medication Sig Start Date End Date Taking? Authorizing Provider  acetaminophen (TYLENOL) 500 MG tablet Take 1,000 mg by mouth every 6 (six) hours as needed for  mild pain, moderate pain or fever.     Historical Provider, MD  aspirin EC 81 MG tablet Take 81 mg by mouth daily.    Historical Provider, MD  Cholecalciferol (VITAMIN D3) 2000 UNITS TABS Take 2,000 Int'l Units by mouth daily.    Historical Provider, MD  diltiazem (CARDIZEM CD) 180 MG 24 hr capsule Take 1 capsule (180 mg total) by mouth daily. 10/08/14   Mihai Croitoru, MD  finasteride (PROSCAR) 5 MG tablet Take 5 mg by mouth daily.    Historical Provider, MD  HYDROcodone-acetaminophen (NORCO) 7.5-325 MG per tablet Take 1 tablet by mouth every 6 (six) hours as needed for moderate pain. 12/13/13   Izora Gala, MD  KLOR-CON 10 10 MEQ tablet TAKE 1 TABLET BY MOUTH EVERY DAY 10/08/14   Mihai  Croitoru, MD  metoprolol succinate (TOPROL-XL) 25 MG 24 hr tablet Take 25 mg by mouth daily.    Historical Provider, MD  metoprolol succinate (TOPROL-XL) 25 MG 24 hr tablet TAKE 1 TABLET BY MOUTH DAILY 10/08/14   Mihai Croitoru, MD  pantoprazole (PROTONIX) 40 MG tablet TAKE 1 TABLET BY MOUTH DAILY 10/16/14   Mihai Croitoru, MD  potassium chloride (K-DUR,KLOR-CON) 10 MEQ tablet Take 10 mEq by mouth daily.    Historical Provider, MD  propylthiouracil (PTU) 50 MG tablet Take 100 mg by mouth 2 (two) times daily.  09/06/12   Historical Provider, MD  rosuvastatin (CRESTOR) 20 MG tablet Take 0.5 tablets (10 mg total) by mouth once a week. Monday 06/08/14   Mihai Croitoru, MD  sod citrate-citric acid (ORACIT) 490-640 MG/5ML SOLN Take 15 mLs by mouth 2 (two) times daily after a meal. Take 3 tablespoons in the morning and 2 tablespoons at night    Historical Provider, MD  tamsulosin (FLOMAX) 0.4 MG CAPS Take 0.8 mg by mouth daily. At bedtime    Historical Provider, MD  triamterene-hydrochlorothiazide (MAXZIDE-25) 37.5-25 MG per tablet Take 1 tablet by mouth daily.    Historical Provider, MD  triamterene-hydrochlorothiazide (MAXZIDE-25) 37.5-25 MG per tablet TAKE 1 TABLET BY MOUTH DAILY 10/08/14   Mihai Croitoru, MD   BP 150/76 mmHg  Pulse 78  Temp(Src) 99 F (37.2 C) (Oral)  Resp 21  Ht '5\' 9"'$  (1.753 m)  Wt 162 lb (73.483 kg)  BMI 23.91 kg/m2  SpO2 96% Physical Exam  Constitutional: He is oriented to person, place, and time. He appears cachectic.  Non-toxic appearance. No distress.  HENT:  Head: Normocephalic and atraumatic.  Eyes: Conjunctivae, EOM and lids are normal. Pupils are equal, round, and reactive to light.  Neck: Normal range of motion. Neck supple. No tracheal deviation present. No thyroid mass present.  Cardiovascular: Normal rate, regular rhythm and normal heart sounds.  Exam reveals no gallop.   No murmur heard. Pulmonary/Chest: Effort normal. No stridor. No respiratory distress. He has  decreased breath sounds. He has no wheezes. He has rhonchi. He has no rales.  Abdominal: Soft. Normal appearance and bowel sounds are normal. He exhibits no distension. There is no tenderness. There is no rebound and no CVA tenderness.  Musculoskeletal: Normal range of motion. He exhibits no edema or tenderness.  Neurological: He is alert and oriented to person, place, and time. He has normal strength. No cranial nerve deficit or sensory deficit. GCS eye subscore is 4. GCS verbal subscore is 5. GCS motor subscore is 6.  Skin: Skin is warm and dry. No abrasion and no rash noted.  Psychiatric: He has a normal mood and affect. His  speech is normal and behavior is normal.  Nursing note and vitals reviewed.   ED Course  Procedures (including critical care time) Labs Review Labs Reviewed  URINE CULTURE  CULTURE, BLOOD (ROUTINE X 2)  CULTURE, BLOOD (ROUTINE X 2)  CBC WITH DIFFERENTIAL/PLATELET  COMPREHENSIVE METABOLIC PANEL  URINALYSIS, ROUTINE W REFLEX MICROSCOPIC (NOT AT [facility])  BRAIN NATRIURETIC PEPTIDE  I-STAT CG4 LACTIC ACID, ED    Imaging Review Dg Chest 2 View  10/22/2014   CLINICAL DATA:  Cough.  EXAM: CHEST  2 VIEW  COMPARISON:  07/21/2013  FINDINGS: New rounded mass density noted in the right lower lung measuring 4.3 cm. Concern for lung cancer. Left lung scratch head no confluent opacity on the left. Prior CABG. Heart is normal size. No effusions. No acute bony abnormality.  IMPRESSION: 4.3 cm right to lower lobe rounded masslike area concerning for malignancy.   Electronically Signed   By: Rolm Baptise M.D.   On: 10/22/2014 19:07     EKG Interpretation None      MDM   Final diagnoses:  Cough    Patient here with weakness and low-grade temperature with CT scan is as likely metastatic spread of cancer. Will be admitted to the hospitalist    Lacretia Leigh, MD 10/22/14 [phone]

## 2014-10-22 NOTE — ED Notes (Signed)
Pt presents via [facility] from home after [employer] called for a wellness check d./t neighbors not being able to get ahold of him since Saturday.  Pt was found in bed in pjs.  Pt remembers not feeling well Saturday when he went to bed but nothing else.  Pt a x 4 with EMS and on arrival.  Pt reports productive cough,white sputum, and not feeling well. BP-155/69 P-80s R-26/28 O2-97% 2L.  EKG-RBBB, trigeminy that resolved before arrival.

## 2014-10-23 ENCOUNTER — Encounter (HOSPITAL_COMMUNITY): Payer: Self-pay | Admitting: *Deleted

## 2014-10-23 DIAGNOSIS — E876 Hypokalemia: Secondary | ICD-10-CM | POA: Diagnosis present

## 2014-10-23 DIAGNOSIS — I6523 Occlusion and stenosis of bilateral carotid arteries: Secondary | ICD-10-CM | POA: Diagnosis present

## 2014-10-23 DIAGNOSIS — K219 Gastro-esophageal reflux disease without esophagitis: Secondary | ICD-10-CM | POA: Diagnosis present

## 2014-10-23 DIAGNOSIS — Z87891 Personal history of nicotine dependence: Secondary | ICD-10-CM | POA: Diagnosis not present

## 2014-10-23 DIAGNOSIS — Z791 Long term (current) use of non-steroidal anti-inflammatories (NSAID): Secondary | ICD-10-CM | POA: Diagnosis not present

## 2014-10-23 DIAGNOSIS — R4182 Altered mental status, unspecified: Secondary | ICD-10-CM | POA: Diagnosis not present

## 2014-10-23 DIAGNOSIS — Z85818 Personal history of malignant neoplasm of other sites of lip, oral cavity, and pharynx: Secondary | ICD-10-CM | POA: Diagnosis not present

## 2014-10-23 DIAGNOSIS — Z888 Allergy status to other drugs, medicaments and biological substances status: Secondary | ICD-10-CM | POA: Diagnosis not present

## 2014-10-23 DIAGNOSIS — C3431 Malignant neoplasm of lower lobe, right bronchus or lung: Secondary | ICD-10-CM | POA: Diagnosis present

## 2014-10-23 DIAGNOSIS — C069 Malignant neoplasm of mouth, unspecified: Secondary | ICD-10-CM | POA: Diagnosis not present

## 2014-10-23 DIAGNOSIS — R627 Adult failure to thrive: Secondary | ICD-10-CM | POA: Diagnosis present

## 2014-10-23 DIAGNOSIS — E440 Moderate protein-calorie malnutrition: Secondary | ICD-10-CM | POA: Diagnosis not present

## 2014-10-23 DIAGNOSIS — R918 Other nonspecific abnormal finding of lung field: Secondary | ICD-10-CM | POA: Diagnosis not present

## 2014-10-23 DIAGNOSIS — I25810 Atherosclerosis of coronary artery bypass graft(s) without angina pectoris: Secondary | ICD-10-CM

## 2014-10-23 DIAGNOSIS — E785 Hyperlipidemia, unspecified: Secondary | ICD-10-CM | POA: Diagnosis present

## 2014-10-23 DIAGNOSIS — Z951 Presence of aortocoronary bypass graft: Secondary | ICD-10-CM | POA: Diagnosis not present

## 2014-10-23 DIAGNOSIS — R630 Anorexia: Secondary | ICD-10-CM | POA: Diagnosis not present

## 2014-10-23 DIAGNOSIS — E1165 Type 2 diabetes mellitus with hyperglycemia: Secondary | ICD-10-CM | POA: Diagnosis present

## 2014-10-23 DIAGNOSIS — Z79899 Other long term (current) drug therapy: Secondary | ICD-10-CM | POA: Diagnosis not present

## 2014-10-23 DIAGNOSIS — Z7982 Long term (current) use of aspirin: Secondary | ICD-10-CM | POA: Diagnosis not present

## 2014-10-23 DIAGNOSIS — H9190 Unspecified hearing loss, unspecified ear: Secondary | ICD-10-CM | POA: Diagnosis present

## 2014-10-23 DIAGNOSIS — N400 Enlarged prostate without lower urinary tract symptoms: Secondary | ICD-10-CM | POA: Diagnosis present

## 2014-10-23 DIAGNOSIS — R05 Cough: Secondary | ICD-10-CM | POA: Diagnosis present

## 2014-10-23 DIAGNOSIS — C049 Malignant neoplasm of floor of mouth, unspecified: Secondary | ICD-10-CM | POA: Diagnosis not present

## 2014-10-23 DIAGNOSIS — Z8249 Family history of ischemic heart disease and other diseases of the circulatory system: Secondary | ICD-10-CM | POA: Diagnosis not present

## 2014-10-23 DIAGNOSIS — I2510 Atherosclerotic heart disease of native coronary artery without angina pectoris: Secondary | ICD-10-CM | POA: Diagnosis present

## 2014-10-23 DIAGNOSIS — E0590 Thyrotoxicosis, unspecified without thyrotoxic crisis or storm: Secondary | ICD-10-CM | POA: Diagnosis present

## 2014-10-23 DIAGNOSIS — N179 Acute kidney failure, unspecified: Secondary | ICD-10-CM | POA: Diagnosis not present

## 2014-10-23 DIAGNOSIS — I350 Nonrheumatic aortic (valve) stenosis: Secondary | ICD-10-CM | POA: Diagnosis present

## 2014-10-23 DIAGNOSIS — I10 Essential (primary) hypertension: Secondary | ICD-10-CM | POA: Diagnosis not present

## 2014-10-23 LAB — COMPREHENSIVE METABOLIC PANEL
ALBUMIN: 3.3 g/dL — AB (ref 3.5–5.0)
ALK PHOS: 52 U/L (ref 38–126)
ALT: 10 U/L — ABNORMAL LOW (ref 17–63)
AST: 17 U/L (ref 15–41)
Anion gap: 9 (ref 5–15)
BUN: 34 mg/dL — AB (ref 6–20)
CHLORIDE: 105 mmol/L (ref 101–111)
CO2: 25 mmol/L (ref 22–32)
Calcium: 8.7 mg/dL — ABNORMAL LOW (ref 8.9–10.3)
Creatinine, Ser: 1.27 mg/dL — ABNORMAL HIGH (ref 0.61–1.24)
GFR calc Af Amer: 56 mL/min — ABNORMAL LOW (ref >60)
GFR calc non Af Amer: 48 mL/min — ABNORMAL LOW (ref >60)
Glucose, Bld: 134 mg/dL — ABNORMAL HIGH (ref 65–99)
Potassium: 3.4 mmol/L — ABNORMAL LOW (ref 3.5–5.1)
SODIUM: 139 mmol/L (ref 135–145)
TOTAL PROTEIN: 6 g/dL — AB (ref 6.5–8.1)
Total Bilirubin: 1.3 mg/dL — ABNORMAL HIGH (ref 0.3–1.2)

## 2014-10-23 LAB — CBC
HCT: 42.9 % (ref 39.0–52.0)
HEMOGLOBIN: 14.7 g/dL (ref 13.0–17.0)
MCH: 29.6 pg (ref 26.0–34.0)
MCHC: 34.3 g/dL (ref 30.0–36.0)
MCV: 86.5 fL (ref 78.0–100.0)
Platelets: 222 10*3/uL (ref 150–400)
RBC: 4.96 MIL/uL (ref 4.22–5.81)
RDW: 13.9 % (ref 11.5–15.5)
WBC: 9.4 10*3/uL (ref 4.0–10.5)

## 2014-10-23 LAB — GLUCOSE, CAPILLARY
GLUCOSE-CAPILLARY: 102 mg/dL — AB (ref 65–99)
GLUCOSE-CAPILLARY: 135 mg/dL — AB (ref 65–99)
Glucose-Capillary: 109 mg/dL — ABNORMAL HIGH (ref 65–99)
Glucose-Capillary: 95 mg/dL (ref 65–99)

## 2014-10-23 LAB — PROTIME-INR
INR: 1.29 (ref 0.00–1.49)
PROTHROMBIN TIME: 16.3 s — AB (ref 11.6–15.2)

## 2014-10-23 LAB — TSH: TSH: 0.45 u[IU]/mL (ref 0.350–4.500)

## 2014-10-23 MED ORDER — TAMSULOSIN HCL 0.4 MG PO CAPS
0.4000 mg | ORAL_CAPSULE | Freq: Every day | ORAL | Status: DC
  Administered 2014-10-23 – 2014-10-27 (×5): 0.4 mg via ORAL
  Filled 2014-10-23 (×6): qty 1

## 2014-10-23 MED ORDER — INSULIN ASPART 100 UNIT/ML ~~LOC~~ SOLN
0.0000 [IU] | Freq: Three times a day (TID) | SUBCUTANEOUS | Status: DC
  Administered 2014-10-25: 2 [IU] via SUBCUTANEOUS
  Administered 2014-10-25: 3 [IU] via SUBCUTANEOUS
  Administered 2014-10-25: 1 [IU] via SUBCUTANEOUS
  Administered 2014-10-26: 2 [IU] via SUBCUTANEOUS

## 2014-10-23 MED ORDER — OXYCODONE HCL 5 MG PO TABS: 5.0000 mg | ORAL_TABLET | ORAL | Status: DC | PRN

## 2014-10-23 MED ORDER — ONDANSETRON HCL 4 MG PO TABS: 4.0000 mg | ORAL_TABLET | Freq: Four times a day (QID) | ORAL | Status: DC | PRN

## 2014-10-23 MED ORDER — ADULT MULTIVITAMIN W/MINERALS CH
1.0000 | ORAL_TABLET | Freq: Every day | ORAL | Status: DC
  Administered 2014-10-23 – 2014-10-28 (×6): 1 via ORAL
  Filled 2014-10-23 (×6): qty 1

## 2014-10-23 MED ORDER — DOCUSATE SODIUM 100 MG PO CAPS
100.0000 mg | ORAL_CAPSULE | Freq: Two times a day (BID) | ORAL | Status: DC
  Administered 2014-10-23 – 2014-10-27 (×8): 100 mg via ORAL
  Filled 2014-10-23 (×10): qty 1

## 2014-10-23 MED ORDER — BISACODYL 10 MG RE SUPP: 10.0000 mg | Freq: Every day | RECTAL | Status: DC | PRN

## 2014-10-23 MED ORDER — ACETAMINOPHEN 325 MG PO TABS: 650.0000 mg | ORAL_TABLET | Freq: Four times a day (QID) | ORAL | Status: DC | PRN

## 2014-10-23 MED ORDER — ROSUVASTATIN CALCIUM 10 MG PO TABS: 10.0000 mg | ORAL_TABLET | ORAL | Status: DC

## 2014-10-23 MED ORDER — POTASSIUM CHLORIDE CRYS ER 20 MEQ PO TBCR
40.0000 meq | EXTENDED_RELEASE_TABLET | Freq: Once | ORAL | Status: AC
  Administered 2014-10-23: 40 meq via ORAL
  Filled 2014-10-23: qty 2

## 2014-10-23 MED ORDER — POTASSIUM CHLORIDE ER 10 MEQ PO TBCR
10.0000 meq | EXTENDED_RELEASE_TABLET | Freq: Every day | ORAL | Status: DC
  Administered 2014-10-23 – 2014-10-28 (×6): 10 meq via ORAL
  Filled 2014-10-23 (×6): qty 1

## 2014-10-23 MED ORDER — FINASTERIDE 5 MG PO TABS
5.0000 mg | ORAL_TABLET | Freq: Every day | ORAL | Status: DC
Start: 2014-10-23 — End: 2014-10-28
  Administered 2014-10-23 – 2014-10-28 (×6): 5 mg via ORAL
  Filled 2014-10-23 (×6): qty 1

## 2014-10-23 MED ORDER — DILTIAZEM HCL ER COATED BEADS 180 MG PO CP24
180.0000 mg | ORAL_CAPSULE | Freq: Every day | ORAL | Status: DC
Start: 2014-10-23 — End: 2014-10-27
  Administered 2014-10-23 – 2014-10-27 (×5): 180 mg via ORAL
  Filled 2014-10-23 (×5): qty 1

## 2014-10-23 MED ORDER — HEPARIN SODIUM (PORCINE) 5000 UNIT/ML IJ SOLN
5000.0000 [IU] | Freq: Three times a day (TID) | INTRAMUSCULAR | Status: DC
  Administered 2014-10-23: 5000 [IU] via SUBCUTANEOUS
  Filled 2014-10-23 (×3): qty 1

## 2014-10-23 MED ORDER — ONDANSETRON HCL 4 MG/2ML IJ SOLN: 4.0000 mg | Freq: Four times a day (QID) | INTRAMUSCULAR | Status: DC | PRN

## 2014-10-23 MED ORDER — HEPARIN SODIUM (PORCINE) 5000 UNIT/ML IJ SOLN
5000.0000 [IU] | Freq: Three times a day (TID) | INTRAMUSCULAR | Status: DC
  Administered 2014-10-23 – 2014-10-27 (×10): 5000 [IU] via SUBCUTANEOUS
  Filled 2014-10-23 (×12): qty 1

## 2014-10-23 MED ORDER — ACETAMINOPHEN 500 MG PO TABS
1000.0000 mg | ORAL_TABLET | Freq: Every day | ORAL | Status: DC
  Administered 2014-10-24: 1000 mg via ORAL
  Filled 2014-10-23 (×2): qty 2

## 2014-10-23 MED ORDER — METOPROLOL SUCCINATE ER 25 MG PO TB24
25.0000 mg | ORAL_TABLET | Freq: Every day | ORAL | Status: DC
  Administered 2014-10-23 – 2014-10-28 (×5): 25 mg via ORAL
  Filled 2014-10-23 (×6): qty 1

## 2014-10-23 MED ORDER — PANTOPRAZOLE SODIUM 40 MG PO TBEC
40.0000 mg | DELAYED_RELEASE_TABLET | Freq: Every day | ORAL | Status: DC
  Administered 2014-10-23 – 2014-10-28 (×6): 40 mg via ORAL
  Filled 2014-10-23 (×5): qty 1

## 2014-10-23 MED ORDER — SODIUM CHLORIDE 0.9 % IV SOLN
INTRAVENOUS | Status: DC
  Administered 2014-10-23 – 2014-10-24 (×2): via INTRAVENOUS

## 2014-10-23 MED ORDER — FOLIC ACID 1 MG PO TABS
1.0000 mg | ORAL_TABLET | Freq: Every day | ORAL | Status: DC
  Administered 2014-10-23 – 2014-10-28 (×6): 1 mg via ORAL
  Filled 2014-10-23 (×6): qty 1

## 2014-10-23 MED ORDER — ACETAMINOPHEN 650 MG RE SUPP: 650.0000 mg | Freq: Four times a day (QID) | RECTAL | Status: DC | PRN

## 2014-10-23 MED ORDER — POLYETHYLENE GLYCOL 3350 17 G PO PACK
17.0000 g | PACK | Freq: Every day | ORAL | Status: DC | PRN
  Filled 2014-10-23: qty 1

## 2014-10-23 MED ORDER — VITAMIN B-1 100 MG PO TABS
100.0000 mg | ORAL_TABLET | Freq: Every day | ORAL | Status: DC
  Administered 2014-10-23 – 2014-10-28 (×6): 100 mg via ORAL
  Filled 2014-10-23 (×6): qty 1

## 2014-10-23 MED ORDER — PROPYLTHIOURACIL 50 MG PO TABS
100.0000 mg | ORAL_TABLET | Freq: Two times a day (BID) | ORAL | Status: DC
  Administered 2014-10-23 – 2014-10-28 (×11): 100 mg via ORAL
  Filled 2014-10-23 (×12): qty 2

## 2014-10-23 NOTE — Progress Notes (Signed)
[facility] PROGRESS NOTE  Austin Ward WCB:[id number] DOB: Apr 29, 1924 DOA: 10/22/2014 PCP: [name], MD  Assessment/Plan: 1. 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 Now presenting with lung mass on the chest x-ray. I discussed in detail with patient the further course of action which includes biopsy of the lung mass, patient agrees for the biopsy. We'll consult IR. 2. 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 Consider oncology consultation based on the path report of the biopsy. 3. Hypertension- blood pressure control, continue Losartan, Cardizem, metoprolol 4. Coronary artery disease- stable, patient has a history of CABG and is followed by [facility] cardiology as outpatient 5. Acute kidney injury- today BUN/creatinine 34/1.27, improved after IV fluids. 6. Hypokalemia- replace potassium and check BMP in a.m. 7. Diabetes mellitus- continue sliding scale insulin with NovoLog.  Code Status: Full code Family Communication: *No family at bedside Disposition Plan: To be decided   Consultants:  None  Procedures:  None  Antibiotics:  None  HPI/Subjective: 81 y.o. male with history of oral cancer HTN Hyperlipidemia GERD presents with cough and an abnormal CT scan. Patient states that he had been having increased fatigue and has been doing a lot of sleeping. He states that he lost track of two days so he decided to come into the hospital. Patient had been having a cough. Patient stats that he has had some sputum no hemoptysis noted. He has had no fever noted. Patient states that he has no chest pain. In the ED he had a CT scan done and this shows presence of metastatic disease possibly in the lungs.  This morning patient denies any pain, concerned about the new lung mass on chest x-ray  Objective: Filed Vitals:   10/23/14 0739  BP: 135/64  Pulse: 85  Temp: 99.7 F (37.6 C)  Resp:  17    Intake/Output Summary (Last 24 hours) at 10/23/14 1355 Last data filed at 10/23/14 1353  Gross per 24 hour  Intake    440 ml  Output    400 ml  Net     40 ml   Filed Weights   10/22/14 1835 10/23/14 0127  Weight: 73.483 kg (162 lb) 66.679 kg (147 lb)    Exam:   General:  Appears in no acute distress  Cardiovascular: S1-S2 is regular for murmurs auscultated  Respiratory: Clear to auscultation bilaterally, no wheezing  Abdomen: Soft, nontender, no organomegaly  Musculoskeletal: No edema. Extremities noted. No cyanosis or clubbing.   Data Reviewed: Basic Metabolic Panel:  Recent Labs Lab 10/22/14 2018 10/23/14 0322  NA 140 139  K 3.6 3.4*  CL 104 105  CO2 24 25  GLUCOSE 124* 134*  BUN 33* 34*  CREATININE 1.37* 1.27*  CALCIUM 9.1 8.7*   Liver Function Tests:  Recent Labs Lab 10/22/14 2018 10/23/14 0322  AST 18 17  ALT 10* 10*  ALKPHOS 55 52  BILITOT 1.3* 1.3*  PROT 6.3* 6.0*  ALBUMIN 3.4* 3.3*   No results for input(s): LIPASE, AMYLASE in the last 168 hours. No results for input(s): AMMONIA in the last 168 hours. CBC:  Recent Labs Lab 10/22/14 2018 10/23/14 0322  WBC 8.5 9.4  NEUTROABS 7.0  --   HGB 15.1 14.7  HCT 43.3 42.9  MCV 85.9 86.5  PLT 225 222   Cardiac Enzymes: No results for input(s): CKTOTAL, CKMB, CKMBINDEX, TROPONINI in the last 168 hours. BNP (last 3  results)  Recent Labs  10/22/14 2018  BNP 315.7*    ProBNP (last 3 results) No results for input(s): PROBNP in the last 8760 hours.  CBG:  Recent Labs Lab 10/23/14 0717 10/23/14 1129  GLUCAP 102* 109*    Recent Results (from the past 240 hour(s))  Culture, blood (routine x 2)     Status: None (Preliminary result)   Collection Time: 10/22/14  8:18 PM  Result Value Ref Range Status   Specimen Description BLOOD RIGHT ARM  Final   Special Requests BOTTLES DRAWN AEROBIC AND ANAEROBIC 5CC  Final   Culture NO GROWTH < 12 HOURS  Final   Report Status PENDING   Incomplete  Culture, blood (routine x 2)     Status: None (Preliminary result)   Collection Time: 10/22/14  8:26 PM  Result Value Ref Range Status   Specimen Description BLOOD RIGHT HAND  Final   Special Requests BOTTLES DRAWN AEROBIC AND ANAEROBIC 5CC  Final   Culture NO GROWTH < 12 HOURS  Final   Report Status PENDING  Incomplete  Urine culture     Status: None (Preliminary result)   Collection Time: 10/22/14  8:35 PM  Result Value Ref Range Status   Specimen Description URINE, CLEAN CATCH  Final   Special Requests NONE  Final   Culture TOO YOUNG TO READ  Final   Report Status PENDING  Incomplete     Studies: Dg Chest 2 View  10/22/2014   CLINICAL DATA:  Cough.  EXAM: CHEST  2 VIEW  COMPARISON:  07/21/2013  FINDINGS: New rounded mass density noted in the right lower lung measuring 4.3 cm. Concern for lung cancer. Left lung scratch head no confluent opacity on the left. Prior CABG. Heart is normal size. No effusions. No acute bony abnormality.  IMPRESSION: 4.3 cm right to lower lobe rounded masslike area concerning for malignancy.   Electronically Signed   By: Rolm Baptise M.D.   On: 10/22/2014 19:07   Ct Chest W Contrast  10/22/2014   CLINICAL DATA:  Patient with productive cough and white sputum. Lymph  EXAM: CT CHEST WITH CONTRAST  TECHNIQUE: Multidetector CT imaging of the chest was performed during intravenous contrast administration.  CONTRAST:  72m OMNIPAQUE IOHEXOL 300 MG/ML  SOLN  COMPARISON:  PET-CT 11/17/2013  FINDINGS: Mediastinum/Nodes: The thyroid gland is markedly enlarged and contains multiple low-attenuation nodules. Heart is normal in size. No pericardial effusion. Coronary arterial vascular calcifications. No enlarged axillary, mediastinal or hilar lymphadenopathy.  Lungs/Pleura: The central airways are patent. Interval development of a 5.3 x 3.2 cm right lower lobe subpleural mass (image 36; series 3) with central lucency. Minimal dependent atelectasis left lower lobe.  Additionally re- demonstrated is a stable 6 mm nodule within the left lower lobe. Re- demonstrated 4 mm predominately ground-glass right lower lobe nodule (image 37; series 3). No pleural effusion or pneumothorax.  Upper abdomen: Stable subcapsular low-attenuation lesion within the liver, right hepatic lobe. Multiple gallstones within the gallbladder lumen. No surrounding inflammatory stranding. Pancreatic atrophy.  Musculoskeletal: Thoracic spine degenerative changes. No aggressive or acute appearing osseous lesions.  IMPRESSION: Interval development of a 5.3 cm subpleural right lower lobe mass with central lucency concerning for neoplastic process, potentially metastatic disease in the setting of known head and neck carcinoma.  Cholelithiasis without CT evidence for acute cholecystitis.   Electronically Signed   By: DLovey NewcomerM.D.   On: 10/22/2014 22:53    Scheduled Meds: . acetaminophen  1,000 mg Oral  Daily  . diltiazem  180 mg Oral Daily  . docusate sodium  100 mg Oral BID  . finasteride  5 mg Oral Daily  . folic acid  1 mg Oral Daily  . heparin  5,000 Units Subcutaneous 3 times per day  . insulin aspart  0-15 Units Subcutaneous TID WC  . metoprolol succinate  25 mg Oral Daily  . multivitamin with minerals  1 tablet Oral Daily  . pantoprazole  40 mg Oral Daily  . potassium chloride  10 mEq Oral Daily  . propylthiouracil  100 mg Oral BID  . [START ON 10/29/2014] rosuvastatin  10 mg Oral Q Mon  . tamsulosin  0.4 mg Oral QHS  . thiamine  100 mg Oral Daily   Continuous Infusions: . sodium chloride 50 mL/hr at 10/23/14 8850    Active Problems:   CAD s/p CABG 1995   Essential hypertension   Dyslipidemia   GERD (gastroesophageal reflux disease)   Cancer of floor of mouth   AKI (acute kidney injury)   Oral cancer   Lung mass    Time spent: 25 min    [facility] Pager [phone]*. If 7PM-7AM, please contact night-coverage at [email or web address], password  [facility] 10/23/2014, 1:55 PM  LOS: 0 days

## 2014-10-23 NOTE — Consult Note (Signed)
Chief Complaint: Chief Complaint  Patient presents with  . Cough  weakness; fatigue  Referring Physician(s): [facility]  History of Present Illness: Austin Ward is a 81 y.o. male   Hx Head/neck cancer Recent weakness; fatigue Increasing cough Denies hemoptysis But definite white sputum production Work up reveals Rt lung mass IMPRESSION: Interval development of a 5.3 cm subpleural right lower lobe mass with central lucency concerning for neoplastic process, potentially metastatic disease in the setting of known head and neck carcinoma.  Request for lung mass bx Dr Annamaria Boots has reviewed imaging and approves procedure  Past Medical History  Diagnosis Date  . CAD (coronary artery disease)   . S/P CABG x 4 02/27/1994    LIMA to LAD,SVG to OM,SVG to 2nd OM,SVG to RCA-Dr. Cyndia Bent  . Heart murmur   . Carotid arterial disease     07/04/12 doppler:right ICA 50-69%,left bulb & ICA 0-49%  . Systemic hypertension   . Dyslipidemia   . Bilateral carotid artery stenosis 04/16/2013  . Aortic valve stenosis 04/16/2013    1.5 cm by echo March 2014  . Essential hypertension 04/16/2013    Did not tolerate higher doses of statins. Satisfactory control on once weekly Crestor   . Occasional tremors     mostly right hand  . Enlarged prostate   . GERD (gastroesophageal reflux disease)   . Cancer     oral cancer  . History of shingles 2005  . Hard of hearing   . Hyperthyroidism   . CHF (congestive heart failure)     Past Surgical History  Procedure Laterality Date  . Coronary artery bypass graft  02/27/1994    LIMA to LAD,SVG to OM,SVG to 2nd OM,SVG to RCA  . Intraocular lens insertion  2000  . Cardiac catheterization  02/25/1994  . US echocardiography  07/04/2012    mild to mod AOV stenosis,mitral annular ca+,LA mildly to mod. dilated  . Nm myoview ltd  04/01/2010    no ischemia, EF 60%  . Eye surgery Bilateral     cataract surgery  . Tonsillectomy    . Colonoscopy    . Mouth  biopsy  12/13/2013    DR ROSEN  . Floor of mouth biopsy N/A 12/13/2013    Procedure: EXCISION FLOOR OF MOUTH TUMOR/SUBMANDIBULAR DUCT REROUTING/EXCISIONAL BIOPSY OF LYMPHNODE;  Surgeon: Izora Gala, MD;  Location: [facility];  Service: ENT;  Laterality: N/A;  . Radical neck dissection Left 04/19/2014    Procedure: LEFT NECK DISSECTION;  Surgeon: Izora Gala, MD;  Location: [facility];  Service: ENT;  Laterality: Left;    Allergies: Ace inhibitors and Lipitor  Medications: Prior to Admission medications   Medication Sig Start Date End Date Taking? Authorizing Provider  acetaminophen (TYLENOL) 500 MG tablet Take 1,000 mg by mouth daily.    Yes Historical Provider, MD  Cholecalciferol (VITAMIN D3) 2000 UNITS TABS Take 2,000 Int'l Units by mouth daily.   Yes Historical Provider, MD  diltiazem (CARDIZEM CD) 180 MG 24 hr capsule Take 1 capsule (180 mg total) by mouth daily. 10/08/14  Yes Mihai Croitoru, MD  finasteride (PROSCAR) 5 MG tablet Take 5 mg by mouth daily.   Yes Historical Provider, MD  KLOR-CON 10 10 MEQ tablet TAKE 1 TABLET BY MOUTH EVERY DAY 10/08/14  Yes Mihai Croitoru, MD  metoprolol succinate (TOPROL-XL) 25 MG 24 hr tablet TAKE 1 TABLET BY MOUTH DAILY 10/08/14  Yes Mihai Croitoru, MD  pantoprazole (PROTONIX) 40 MG tablet TAKE 1 TABLET BY MOUTH DAILY  10/16/14  Yes Mihai Croitoru, MD  propylthiouracil (PTU) 50 MG tablet Take 100 mg by mouth 2 (two) times daily.  09/06/12  Yes Historical Provider, MD  rosuvastatin (CRESTOR) 20 MG tablet Take 0.5 tablets (10 mg total) by mouth once a week. Monday 06/08/14  Yes Mihai Croitoru, MD  sod citrate-citric acid (ORACIT) 490-640 MG/5ML SOLN Take 15 mLs by mouth 2 (two) times daily after a meal. Take 3 tablespoons in the morning and 2 tablespoons at night   Yes Historical Provider, MD  tamsulosin (FLOMAX) 0.4 MG CAPS Take 0.4 mg by mouth at bedtime. At bedtime   Yes Historical Provider, MD  triamterene-hydrochlorothiazide (MAXZIDE-25) 37.5-25 MG per tablet TAKE 1  TABLET BY MOUTH DAILY 10/08/14  Yes Mihai Croitoru, MD  HYDROcodone-acetaminophen (NORCO) 7.5-325 MG per tablet Take 1 tablet by mouth every 6 (six) hours as needed for moderate pain. Patient not taking: Reported on 10/22/2014 12/13/13   Izora Gala, MD     Family History  Problem Relation Age of Onset  . CAD Mother   . Hypertension Mother     History   Social History  . Marital Status: Widowed    Spouse Name: N/A  . Number of Children: N/A  . Years of Education: N/A   Social History Main Topics  . Smoking status: Former Smoker    Quit date: 05/04/1971  . Smokeless tobacco: Never Used  . Alcohol Use: No  . Drug Use: No  . Sexual Activity: Not on file   Other Topics Concern  . None   Social History Narrative     Review of Systems: A 12 point ROS discussed and pertinent positives are indicated in the HPI above.  All other systems are negative.  Review of Systems  Constitutional: Positive for activity change, appetite change and fatigue.  Respiratory: Positive for cough and shortness of breath.   Cardiovascular: Negative for chest pain.  Neurological: Positive for weakness.  Psychiatric/Behavioral: Negative for behavioral problems and confusion.    Vital Signs: BP 135/64 mmHg  Pulse 85  Temp(Src) 99.7 F (37.6 C) (Oral)  Resp 17  Ht '5\' 9"'$  (1.753 m)  Wt 147 lb (66.679 kg)  BMI 21.70 kg/m2  SpO2 94%  Physical Exam  Constitutional: He is oriented to person, place, and time. He appears well-nourished.  HENT:  HOH  Cardiovascular: Normal rate and regular rhythm.   No murmur heard. Pulmonary/Chest: Effort normal and breath sounds normal. He has no wheezes.  Abdominal: Soft. Bowel sounds are normal.  Musculoskeletal: Normal range of motion.  Neurological: He is alert and oriented to person, place, and time.  Skin: Skin is warm and dry.  Psychiatric: He has a normal mood and affect. His behavior is normal. Judgment and thought content normal.  Nursing note and  vitals reviewed.   Mallampati Score:  MD Evaluation Airway: WNL Heart: WNL Abdomen: WNL Chest/ Lungs: WNL ASA  Classification: 3 Mallampati/Airway Score: One  Imaging: Dg Chest 2 View  10/22/2014   CLINICAL DATA:  Cough.  EXAM: CHEST  2 VIEW  COMPARISON:  07/21/2013  FINDINGS: New rounded mass density noted in the right lower lung measuring 4.3 cm. Concern for lung cancer. Left lung scratch head no confluent opacity on the left. Prior CABG. Heart is normal size. No effusions. No acute bony abnormality.  IMPRESSION: 4.3 cm right to lower lobe rounded masslike area concerning for malignancy.   Electronically Signed   By: Rolm Baptise M.D.   On: 10/22/2014 19:07  Ct Chest W Contrast  10/22/2014   CLINICAL DATA:  Patient with productive cough and white sputum. Lymph  EXAM: CT CHEST WITH CONTRAST  TECHNIQUE: Multidetector CT imaging of the chest was performed during intravenous contrast administration.  CONTRAST:  4m OMNIPAQUE IOHEXOL 300 MG/ML  SOLN  COMPARISON:  PET-CT 11/17/2013  FINDINGS: Mediastinum/Nodes: The thyroid gland is markedly enlarged and contains multiple low-attenuation nodules. Heart is normal in size. No pericardial effusion. Coronary arterial vascular calcifications. No enlarged axillary, mediastinal or hilar lymphadenopathy.  Lungs/Pleura: The central airways are patent. Interval development of a 5.3 x 3.2 cm right lower lobe subpleural mass (image 36; series 3) with central lucency. Minimal dependent atelectasis left lower lobe. Additionally re- demonstrated is a stable 6 mm nodule within the left lower lobe. Re- demonstrated 4 mm predominately ground-glass right lower lobe nodule (image 37; series 3). No pleural effusion or pneumothorax.  Upper abdomen: Stable subcapsular low-attenuation lesion within the liver, right hepatic lobe. Multiple gallstones within the gallbladder lumen. No surrounding inflammatory stranding. Pancreatic atrophy.  Musculoskeletal: Thoracic spine  degenerative changes. No aggressive or acute appearing osseous lesions.  IMPRESSION: Interval development of a 5.3 cm subpleural right lower lobe mass with central lucency concerning for neoplastic process, potentially metastatic disease in the setting of known head and neck carcinoma.  Cholelithiasis without CT evidence for acute cholecystitis.   Electronically Signed   By: DLovey NewcomerM.D.   On: 10/22/2014 22:53    Labs:  CBC:  Recent Labs  12/08/13 1442 04/19/14 1039 10/22/14 2018 10/23/14 0322  WBC 8.9 6.7 8.5 9.4  HGB 13.6 12.7* 15.1 14.7  HCT 41.2 39.0 43.3 42.9  PLT 211 210 225 222    COAGS:  Recent Labs  10/23/14 0322  INR 1.29    BMP:  Recent Labs  12/08/13 1442 04/19/14 1039 10/09/14 0939 10/22/14 2018 10/23/14 0322  NA 143 141 139 140 139  K 4.4 3.9 3.7 3.6 3.4*  CL 103 103 100 104 105  CO2 '27 27 28 24 25  '$ GLUCOSE 111* 106* 94 124* 134*  BUN 26* 30* 27* 33* 34*  CALCIUM 9.3 9.3 9.4 9.1 8.7*  CREATININE 1.23 1.20 1.20 1.37* 1.27*  GFRNONAA 51* 52*  --  44* 48*  GFRAA 59* 60*  --  51* 56*    LIVER FUNCTION TESTS:  Recent Labs  10/09/14 0939 10/22/14 2018 10/23/14 0322  BILITOT 0.6 1.3* 1.3*  AST '15 18 17  '$ ALT 8 10* 10*  ALKPHOS 53 55 52  PROT 6.5 6.3* 6.0*  ALBUMIN 3.7 3.4* 3.3*    TUMOR MARKERS: No results for input(s): AFPTM, CEA, CA199, CHROMGRNA in the last 8760 hours.  Assessment and Plan:  Cough; productive cough Weakness New Rt lung mass Hx H/N Ca Now scheduled for lung mass bx Risks and Benefits discussed with the patient including, but not limited to bleeding, hemoptysis, respiratory failure requiring intubation, infection, pneumothorax requiring chest tube placement, stroke from air embolism or even death. All of the patient's questions were answered, patient is agreeable to proceed. Consent signed and in chart.  Will hold Hep inj 6/22 for bx in Radiology  Thank you for this interesting consult.  I greatly enjoyed  meeting FGranvel Proudfootand look forward to participating in their care.  Signed: Kipper Buch A 10/23/2014, 11:22 AM   I spent a total of 40 Minutes    in face to face in clinical consultation, greater than 50% of which was counseling/coordinating care for Rt lung mass  bx

## 2014-10-23 NOTE — Progress Notes (Signed)
Patient possibly going for lung biopsy today; patient states he'd rather take his PO meds after he finds out if he's going for his procedure or not. Call placed to IR.   Joellen Jersey, RN.

## 2014-10-24 ENCOUNTER — Inpatient Hospital Stay (HOSPITAL_COMMUNITY): Payer: Medicare Other

## 2014-10-24 ENCOUNTER — Encounter (HOSPITAL_COMMUNITY): Payer: Self-pay | Admitting: Radiology

## 2014-10-24 DIAGNOSIS — I10 Essential (primary) hypertension: Secondary | ICD-10-CM

## 2014-10-24 DIAGNOSIS — R627 Adult failure to thrive: Secondary | ICD-10-CM

## 2014-10-24 DIAGNOSIS — F05 Delirium due to known physiological condition: Secondary | ICD-10-CM

## 2014-10-24 DIAGNOSIS — C069 Malignant neoplasm of mouth, unspecified: Secondary | ICD-10-CM

## 2014-10-24 DIAGNOSIS — R918 Other nonspecific abnormal finding of lung field: Secondary | ICD-10-CM

## 2014-10-24 LAB — BASIC METABOLIC PANEL
Anion gap: 10 (ref 5–15)
BUN: 34 mg/dL — AB (ref 6–20)
CHLORIDE: 111 mmol/L (ref 101–111)
CO2: 25 mmol/L (ref 22–32)
CREATININE: 1.28 mg/dL — AB (ref 0.61–1.24)
Calcium: 9 mg/dL (ref 8.9–10.3)
GFR calc Af Amer: 55 mL/min — ABNORMAL LOW (ref >60)
GFR calc non Af Amer: 48 mL/min — ABNORMAL LOW (ref >60)
Glucose, Bld: 111 mg/dL — ABNORMAL HIGH (ref 65–99)
Potassium: 3.9 mmol/L (ref 3.5–5.1)
Sodium: 146 mmol/L — ABNORMAL HIGH (ref 135–145)

## 2014-10-24 LAB — GLUCOSE, CAPILLARY
GLUCOSE-CAPILLARY: 101 mg/dL — AB (ref 65–99)
GLUCOSE-CAPILLARY: 102 mg/dL — AB (ref 65–99)
GLUCOSE-CAPILLARY: 116 mg/dL — AB (ref 65–99)
Glucose-Capillary: 129 mg/dL — ABNORMAL HIGH (ref 65–99)
Glucose-Capillary: 151 mg/dL — ABNORMAL HIGH (ref 65–99)

## 2014-10-24 LAB — HEMOGLOBIN A1C
Hgb A1c MFr Bld: 5.6 % (ref 4.8–5.6)
Mean Plasma Glucose: 114 mg/dL

## 2014-10-24 LAB — URINE CULTURE

## 2014-10-24 MED ORDER — IOHEXOL 300 MG/ML  SOLN
25.0000 mL | INTRAMUSCULAR | Status: AC
  Administered 2014-10-24 (×2): 25 mL via ORAL

## 2014-10-24 MED ORDER — MIDAZOLAM HCL 2 MG/2ML IJ SOLN
INTRAMUSCULAR | Status: AC
  Filled 2014-10-24: qty 4

## 2014-10-24 MED ORDER — FENTANYL CITRATE (PF) 100 MCG/2ML IJ SOLN
INTRAMUSCULAR | Status: AC | PRN
  Administered 2014-10-24 (×2): 25 ug via INTRAVENOUS

## 2014-10-24 MED ORDER — FENTANYL CITRATE (PF) 100 MCG/2ML IJ SOLN
INTRAMUSCULAR | Status: AC
  Filled 2014-10-24: qty 4

## 2014-10-24 MED ORDER — LIDOCAINE HCL 1 % IJ SOLN
INTRAMUSCULAR | Status: AC
  Administered 2014-10-24: 14:00:00
  Filled 2014-10-24: qty 20

## 2014-10-24 MED ORDER — KCL IN DEXTROSE-NACL 20-5-0.45 MEQ/L-%-% IV SOLN
INTRAVENOUS | Status: DC
  Administered 2014-10-24: 75 mL/h via INTRAVENOUS
  Administered 2014-10-25: 13:00:00 via INTRAVENOUS
  Filled 2014-10-24 (×4): qty 1000

## 2014-10-24 MED ORDER — MIDAZOLAM HCL 2 MG/2ML IJ SOLN
INTRAMUSCULAR | Status: AC | PRN
  Administered 2014-10-24 (×2): 0.5 mg via INTRAVENOUS

## 2014-10-24 NOTE — Progress Notes (Signed)
Pt is for CT of the abdomen with oral contrast as ordered. Pt has consumed approx. 369m out of 6538moral contrast and refused to drink the remaining amout. CT staff Sam made aware.

## 2014-10-24 NOTE — Sedation Documentation (Signed)
Waiting on MD to arrive

## 2014-10-24 NOTE — Procedures (Signed)
Interventional Radiology Procedure Note  Procedure: CT guided biopsy of right lung mass Complications: No immediate Recommendations: - recommend right decubitus for recovery, if uncomfortable, may be supine. - Bedrest until CXR cleared.  Minimize talking, coughing or otherwise straining.  - Follow up 1 hr CXR pending   Signed,  Signed,  Elmer Boutelle S. Earleen Newport, DO

## 2014-10-24 NOTE — Progress Notes (Signed)
[facility] PROGRESS NOTE  Austin Ward BSW:[id number] DOB: 1924/03/20 DOA: 10/22/2014 PCP: [name], MD  Assessment/Plan: 1. 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 Now presenting with lung mass on the chest x-ray.s/p biopsy by IR on 6/22, awaiting for pathology report.  2. 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 Consider oncology consultation based on the path report of the biopsy. Does report cough, will get speech to eval to r/o aspiration. 3. Hypertension- blood pressure control, continue Losartan, Cardizem, metoprolol 4. Coronary artery disease- stable, patient has a history of CABG and is followed by [facility] cardiology as outpatient 5. Acute kidney injury- vs baseline, today BUN/creatinine 34/1.27, improved after IV fluids. 6. Hypokalemia- replace potassium and check BMP in a.m.  Check mag 7. Diabetes mellitus- continue sliding scale insulin with NovoLog. 8.  confusion, neighbor reported baseline poor memory fatigue, does has some resting tremor, will get Ct head first. Consider trial of sinemet. 9. Cough: did present with mild fever, currently no fever, no leukocytosis, will get swallow eval,  Consider abx if spike fever again.  10. FTT, will get physical therapy eval, likely will need snf.  Code Status: Full code Family Communication: No immediate family in [place]. HPOA cell phone [phone]. [place] neiers who is a Therapist, sports work at [employer] day [employer] cell [phone] Disposition Plan: To be decided, likely will need snf   Consultants:  IR  Procedures:  Ct guided Lung mass biopsy by IR on 6//22  Antibiotics:  None  HPI/Subjective: 81 y.o. male with history of oral cancer HTN Hyperlipidemia GERD presents with cough and an abnormal CT scan. Patient states that he had been having increased fatigue and has been doing a lot of sleeping. He states that he lost track of two  days so he decided to come into the hospital. Patient had been having a cough. Patient stats that he has had some sputum no hemoptysis noted. He has had no fever noted. Patient states that he has no chest pain. In the ED he had a CT scan done and this shows presence of metastatic disease possibly in the lungs.  Patient is oriented x3, but very confused about situation, speech repetitive, incoherent, multiple friends in room   Objective: Filed Vitals:   10/24/14 1618  BP: 148/68  Pulse: 84  Temp: 98.4 F (36.9 C)  Resp: 17    Intake/Output Summary (Last 24 hours) at 10/24/14 1855 Last data filed at 10/24/14 1749  Gross per 24 hour  Intake 601.67 ml  Output    100 ml  Net 501.67 ml   Filed Weights   10/22/14 1835 10/23/14 0127  Weight: 73.483 kg (162 lb) 66.679 kg (147 lb)    Exam:   General:  confused  Cardiovascular: S1-S2 is regular for murmurs auscultated  Respiratory: Clear to auscultation bilaterally, no wheezing  Abdomen: Soft, nontender, no organomegaly  Musculoskeletal: No edema. Extremities noted. No cyanosis or clubbing.   Neuro: confused, mild resting tremor, ataxia, more right lower extremity. Hard of hearing  Data Reviewed: Basic Metabolic Panel:  Recent Labs Lab 10/22/14 2018 10/23/14 0322 10/24/14 0615  NA 140 139 146*  K 3.6 3.4* 3.9  CL 104 105 111  CO2 '24 25 25  '$ GLUCOSE 124* 134* 111*  BUN 33* 34* 34*  CREATININE 1.37* 1.27* 1.28*  CALCIUM 9.1 8.7* 9.0   Liver Function Tests:  Recent Labs Lab 10/22/14  2018 10/23/14 0322  AST 18 17  ALT 10* 10*  ALKPHOS 55 52  BILITOT 1.3* 1.3*  PROT 6.3* 6.0*  ALBUMIN 3.4* 3.3*   No results for input(s): LIPASE, AMYLASE in the last 168 hours. No results for input(s): AMMONIA in the last 168 hours. CBC:  Recent Labs Lab 10/22/14 2018 10/23/14 0322  WBC 8.5 9.4  NEUTROABS 7.0  --   HGB 15.1 14.7  HCT 43.3 42.9  MCV 85.9 86.5  PLT 225 222   Cardiac Enzymes: No results for input(s):  CKTOTAL, CKMB, CKMBINDEX, TROPONINI in the last 168 hours. BNP (last 3 results)  Recent Labs  10/22/14 2018  BNP 315.7*    ProBNP (last 3 results) No results for input(s): PROBNP in the last 8760 hours.  CBG:  Recent Labs Lab 10/23/14 2123 10/24/14 0451 10/24/14 0733 10/24/14 1320 10/24/14 1617  GLUCAP 95 101* 102* 129* 116*    Recent Results (from the past 240 hour(s))  Culture, blood (routine x 2)     Status: None (Preliminary result)   Collection Time: 10/22/14  8:18 PM  Result Value Ref Range Status   Specimen Description BLOOD RIGHT ARM  Final   Special Requests BOTTLES DRAWN AEROBIC AND ANAEROBIC 5CC  Final   Culture NO GROWTH 2 DAYS  Final   Report Status PENDING  Incomplete  Culture, blood (routine x 2)     Status: None (Preliminary result)   Collection Time: 10/22/14  8:26 PM  Result Value Ref Range Status   Specimen Description BLOOD RIGHT HAND  Final   Special Requests BOTTLES DRAWN AEROBIC AND ANAEROBIC 5CC  Final   Culture NO GROWTH 2 DAYS  Final   Report Status PENDING  Incomplete  Urine culture     Status: None   Collection Time: 10/22/14  8:35 PM  Result Value Ref Range Status   Specimen Description URINE, CLEAN CATCH  Final   Special Requests NONE  Final   Culture   Final    MULTIPLE SPECIES PRESENT, SUGGEST RECOLLECTION IF CLINICALLY INDICATED   Report Status 10/24/2014 FINAL  Final     Studies: Dg Chest 2 View  10/22/2014   CLINICAL DATA:  Cough.  EXAM: CHEST  2 VIEW  COMPARISON:  07/21/2013  FINDINGS: New rounded mass density noted in the right lower lung measuring 4.3 cm. Concern for lung cancer. Left lung scratch head no confluent opacity on the left. Prior CABG. Heart is normal size. No effusions. No acute bony abnormality.  IMPRESSION: 4.3 cm right to lower lobe rounded masslike area concerning for malignancy.   Electronically Signed   By: Rolm Baptise M.D.   On: 10/22/2014 19:07   Ct Chest W Contrast  10/22/2014   CLINICAL DATA:  Patient  with productive cough and white sputum. Lymph  EXAM: CT CHEST WITH CONTRAST  TECHNIQUE: Multidetector CT imaging of the chest was performed during intravenous contrast administration.  CONTRAST:  19m OMNIPAQUE IOHEXOL 300 MG/ML  SOLN  COMPARISON:  PET-CT 11/17/2013  FINDINGS: Mediastinum/Nodes: The thyroid gland is markedly enlarged and contains multiple low-attenuation nodules. Heart is normal in size. No pericardial effusion. Coronary arterial vascular calcifications. No enlarged axillary, mediastinal or hilar lymphadenopathy.  Lungs/Pleura: The central airways are patent. Interval development of a 5.3 x 3.2 cm right lower lobe subpleural mass (image 36; series 3) with central lucency. Minimal dependent atelectasis left lower lobe. Additionally re- demonstrated is a stable 6 mm nodule within the left lower lobe. Re- demonstrated 4 mm predominately  ground-glass right lower lobe nodule (image 37; series 3). No pleural effusion or pneumothorax.  Upper abdomen: Stable subcapsular low-attenuation lesion within the liver, right hepatic lobe. Multiple gallstones within the gallbladder lumen. No surrounding inflammatory stranding. Pancreatic atrophy.  Musculoskeletal: Thoracic spine degenerative changes. No aggressive or acute appearing osseous lesions.  IMPRESSION: Interval development of a 5.3 cm subpleural right lower lobe mass with central lucency concerning for neoplastic process, potentially metastatic disease in the setting of known head and neck carcinoma.  Cholelithiasis without CT evidence for acute cholecystitis.   Electronically Signed   By: Lovey Newcomer M.D.   On: 10/22/2014 22:53   Ct Biopsy  10/24/2014   CLINICAL DATA:  81 year old male with a history of head and neck carcinoma, with recent admission and evidence of new right lung mass of the right lower lobe.  He has been referred for biopsy.  EXAM: CT-GUIDED BIOPSY right lung mass.  MEDICATIONS AND MEDICAL HISTORY: Versed 1.0 mg, Fentanyl 50 mcg.   Additional Medications: None.  ANESTHESIA/SEDATION: Moderate sedation time: 18 minutes  PROCEDURE: The procedure, risks, benefits, and alternatives were explained to the patient. Questions regarding the procedure were encouraged and answered. The patient understands and consents to the procedure.  Patient position in right decubitus position with scout CT of the chest performed.  The patient was then prepped and draped in the usual sterile fashion, the skin and subcutaneous tissues overlying the right lower lobe mass were generously infiltrated with 1% lidocaine for local anesthesia. A small stab incision was made with 11 blade scalpel, and using CT guidance, a guide needle was advanced into the mass.  Three separate 18 gauge core biopsy were retrieved.  The needle was then removed.  Final image was stored.  Patient tolerated the procedure well and remained hemodynamically stable throughout.  No complications were encountered and no significant blood loss was encountered.  FINDINGS: The images document guide needle placement within the right lower lobe mass. Post biopsy images demonstrate no pneumothorax or significant hemorrhage.  COMPLICATIONS: None  IMPRESSION: Status post right lower lobe mass biopsy, with tissue specimen sent to pathology for complete histopathologic analysis.  Signed,  Dulcy Fanny. Earleen Newport, DO  [facility]  [facility]  PLAN: Patient will be observed in right decubitus position or supine position for 3 hours.  Chest x-ray acquired at 2 p.m. to observe for potential pneumothorax development.  The patient will be NPO until completion of the chest x-ray.  After clearance, patient may receive prophylactic anti coagulation for DVT prophylaxis.   Electronically Signed   By: Corrie Mckusick D.O.   On: 10/24/2014 13:09   Dg Chest Port 1 View  10/24/2014   CLINICAL DATA:  Lung mass post biopsy  EXAM: PORTABLE CHEST - 1 VIEW  COMPARISON:  Portable exam 1511  hours compared to 10/22/2014  FINDINGS: Normal heart size post CABG.  Atherosclerotic calcification aorta.  Mediastinal contours and pulmonary vascularity normal.  Bronchitic changes again noted.  4 cm diameter RIGHT perihilar mass again identified, unchanged.  No infiltrate, pleural effusion or pneumothorax.  IMPRESSION: Bronchitic changes with stable appearance of RIGHT perihilar mass.  No pneumothorax post biopsy.   Electronically Signed   By: Lavonia Dana M.D.   On: 10/24/2014 15:18    Scheduled Meds: . acetaminophen  1,000 mg Oral Daily  . diltiazem  180 mg Oral Daily  . docusate sodium  100 mg Oral BID  . fentaNYL      . finasteride  5 mg Oral Daily  . folic acid  1 mg Oral Daily  . heparin  5,000 Units Subcutaneous 3 times per day  . insulin aspart  0-15 Units Subcutaneous TID WC  . metoprolol succinate  25 mg Oral Daily  . midazolam      . multivitamin with minerals  1 tablet Oral Daily  . pantoprazole  40 mg Oral Daily  . potassium chloride  10 mEq Oral Daily  . propylthiouracil  100 mg Oral BID  . [START ON 10/29/2014] rosuvastatin  10 mg Oral Q Mon  . tamsulosin  0.4 mg Oral QHS  . thiamine  100 mg Oral Daily   Continuous Infusions: . sodium chloride 50 mL/hr at 10/24/14 6484    Active Problems:   CAD s/p CABG 1995   Essential hypertension   Dyslipidemia   GERD (gastroesophageal reflux disease)   Cancer of floor of mouth   AKI (acute kidney injury)   Oral cancer   Lung mass    Time spent: 25 min    Jaxin Fulfer MD PhD  [facility] Pager [phone]. If 7PM-7AM, please contact night-coverage at [email or web address], password [facility] 10/24/2014, 6:55 PM  LOS: 1 day

## 2014-10-25 DIAGNOSIS — C049 Malignant neoplasm of floor of mouth, unspecified: Secondary | ICD-10-CM

## 2014-10-25 DIAGNOSIS — N179 Acute kidney failure, unspecified: Secondary | ICD-10-CM

## 2014-10-25 LAB — COMPREHENSIVE METABOLIC PANEL
ALT: 12 U/L — ABNORMAL LOW (ref 17–63)
AST: 18 U/L (ref 15–41)
Albumin: 2.9 g/dL — ABNORMAL LOW (ref 3.5–5.0)
Alkaline Phosphatase: 49 U/L (ref 38–126)
Anion gap: 8 (ref 5–15)
BILIRUBIN TOTAL: 0.9 mg/dL (ref 0.3–1.2)
BUN: 30 mg/dL — ABNORMAL HIGH (ref 6–20)
CALCIUM: 9.2 mg/dL (ref 8.9–10.3)
CO2: 28 mmol/L (ref 22–32)
CREATININE: 1.14 mg/dL (ref 0.61–1.24)
Chloride: 109 mmol/L (ref 101–111)
GFR calc Af Amer: >60 mL/min (ref >60)
GFR calc non Af Amer: 55 mL/min — ABNORMAL LOW (ref >60)
GLUCOSE: 130 mg/dL — AB (ref 65–99)
Potassium: 3.6 mmol/L (ref 3.5–5.1)
Sodium: 145 mmol/L (ref 135–145)
Total Protein: 5.5 g/dL — ABNORMAL LOW (ref 6.5–8.1)

## 2014-10-25 LAB — GLUCOSE, CAPILLARY
GLUCOSE-CAPILLARY: 123 mg/dL — AB (ref 65–99)
GLUCOSE-CAPILLARY: 187 mg/dL — AB (ref 65–99)
GLUCOSE-CAPILLARY: 138 mg/dL — AB (ref 65–99)
GLUCOSE-CAPILLARY: 124 mg/dL — AB (ref 65–99)

## 2014-10-25 LAB — CBC
HEMATOCRIT: 41.2 % (ref 39.0–52.0)
HEMOGLOBIN: 14 g/dL (ref 13.0–17.0)
MCH: 29.3 pg (ref 26.0–34.0)
MCHC: 34 g/dL (ref 30.0–36.0)
MCV: 86.2 fL (ref 78.0–100.0)
PLATELETS: 245 10*3/uL (ref 150–400)
RBC: 4.78 MIL/uL (ref 4.22–5.81)
RDW: 13.8 % (ref 11.5–15.5)
WBC: 7.6 10*3/uL (ref 4.0–10.5)

## 2014-10-25 LAB — MAGNESIUM: Magnesium: 2.1 mg/dL (ref 1.7–2.4)

## 2014-10-25 NOTE — Clinical Social Work Placement (Signed)
   CLINICAL SOCIAL WORK PLACEMENT  NOTE  Date:  10/25/2014  Patient Details  Name: Austin Ward MRN: [id number] Date of Birth: Oct 05, 1923  Clinical Social Work is seeking post-discharge placement for this patient at the [place] level of care (*CSW will initial, date and re-position this form in  chart as items are completed):  Yes   Patient/family provided with [facility] Work Department's list of facilities offering this level of care within the geographic area requested by the patient (or if unable, by the patient's family).  Yes   Patient/family informed of their freedom to choose among providers that offer the needed level of care, that participate in Medicare, Medicaid or managed care program needed by the patient, have an available bed and are willing to accept the patient.  Yes   Patient/family informed of [facility]'s ownership interest in [facility] and [facility], as well as of the fact that they are under no obligation to receive care at these facilities.  PASRR submitted to EDS on 10/25/14     PASRR number received on 10/25/14     Existing PASRR number confirmed on       FL2 transmitted to all facilities in geographic area requested by pt/family on 10/25/14     FL2 transmitted to all facilities within larger geographic area on       Patient informed that his/her managed care company has contracts with or will negotiate with certain facilities, including the following:            Patient/family informed of bed offers received.  Patient chooses bed at       Physician recommends and patient chooses bed at      Patient to be transferred to   on  .  Patient to be transferred to facility by       Patient family notified on   of transfer.  Name of family member notified:        PHYSICIAN Please sign FL2     Additional Comment:    _______________________________________________ Cranford Mon,  LCSW 10/25/2014, 4:23 PM

## 2014-10-25 NOTE — Evaluation (Signed)
Physical Therapy Evaluation Patient Details Name: Momen Ham MRN: [id number] DOB: 11/19/1923 Today's Date: 10/25/2014   History of Present Illness  81 y.o. male with history of oral cancer HTN Hyperlipidemia GERD presents with cough and an abnormal CT scan. Patient states that he had been having increased fatigue and has been doing a lot of sleeping. He states that he lost track of two days so he decided to come into the hospital. Patient had been having a cough.  Lung mass on the chest x-ray.s/p biopsy by IR on 6/22, awaiting for pathology report.   Clinical Impression  Pt admitted with/for cough and abnormal CT suspicious for lung mass..  Pt currently limited functionally due to the problems listed. ( See problems list.)   Pt will benefit from PT to maximize function and safety in order to get ready for next venue listed below.     Follow Up Recommendations SNF    Equipment Recommendations  Other (comment);None recommended by PT (TBA)    Recommendations for Other Services       Precautions / Restrictions Precautions Precautions: Fall      Mobility  Bed Mobility Overal bed mobility: Needs Assistance Bed Mobility: Supine to Sit     Supine to sit: Min guard (heavy use of the rail)        Transfers Overall transfer level: Needs assistance   Transfers: Sit to/from Stand Sit to Stand: Min assist         General transfer comment: weight leaning back against the bed rail significantly for support  Ambulation/Gait Ambulation/Gait assistance: Min assist Ambulation Distance (Feet): 130 Feet   Gait Pattern/deviations: Step-through pattern;Trendelenburg Gait velocity: slower   General Gait Details: short step length with hip drop on the right due to L hip weakness, shorter time on LLE.  Stairs            Wheelchair Mobility    Modified Rankin (Stroke Patients Only)       Balance Overall balance assessment: Needs assistance Sitting-balance  support: No upper extremity supported Sitting balance-Leahy Scale: Fair     Standing balance support: Single extremity supported;No upper extremity supported Standing balance-Leahy Scale: Poor Standing balance comment: needs some support for safety when static.                             Pertinent Vitals/Pain Pain Assessment: No/denies pain    Home Living Family/patient expects to be discharged to:: Private residence Living Arrangements: Alone Available Help at Discharge: Friend(s);Available PRN/intermittently Type of Home: House Home Access: Stairs to enter Entrance Stairs-Rails: Can reach both Entrance Stairs-Number of Steps: 3 Home Layout: One level Home Equipment:  (tba)      Prior Function Level of Independence: Independent         Comments: Uses no assistive device, but reaches for furniture at home by habit.     Hand Dominance        Extremity/Trunk Assessment   Upper Extremity Assessment: Overall WFL for tasks assessed           Lower Extremity Assessment: Overall WFL for tasks assessed;Generalized weakness;LLE deficits/detail (mild proximal LE and truncal weaknesses)   LLE Deficits / Details: L proximal weakness > than R, noted during gait.     Communication   Communication: No difficulties  Cognition Arousal/Alertness: Awake/alert Behavior During Therapy: WFL for tasks assessed/performed Overall Cognitive Status: Within Functional Limits for tasks assessed       Memory:  Decreased short-term memory              General Comments General comments (skin integrity, edema, etc.): warm up ROM exercise prior to mobility    Exercises        Assessment/Plan    PT Assessment Patient needs continued PT services  PT Diagnosis Abnormality of gait;Generalized weakness   PT Problem List Decreased activity tolerance;Decreased strength;Decreased balance;Decreased mobility;Decreased knowledge of use of DME;Cardiopulmonary status  limiting activity  PT Treatment Interventions DME instruction;Gait training;Functional mobility training;Therapeutic activities;Balance training;Patient/family education   PT Goals (Current goals can be found in the Care Plan section) Acute Rehab PT Goals Patient Stated Goal: home independent PT Goal Formulation: With patient Time For Goal Achievement: 11/08/14 Potential to Achieve Goals: Good    Frequency Min 3X/week   Barriers to discharge Decreased caregiver support      Co-evaluation               End of Session   Activity Tolerance: Patient tolerated treatment well Patient left: in chair;with call bell/phone within reach;with family/visitor present Nurse Communication: Mobility status         Time: 8833-7445 PT Time Calculation (min) (ACUTE ONLY): 37 min   Charges:   PT Evaluation $Initial PT Evaluation Tier I: 1 Procedure PT Treatments $Gait Training: 8-22 mins   PT G Codes:        Loc Feinstein, Tessie Fass 10/25/2014, 4:07 PM  10/25/2014  Donnella Sham, PT [phone]  (pager)

## 2014-10-25 NOTE — Clinical Social Work Note (Signed)
Clinical Social Work Assessment  Patient Details  Name: Austin Ward MRN: [id number] Date of Birth: 06-Dec-1923  Date of referral:  10/25/14               Reason for consult:  Facility Placement                Permission sought to share information with:  Family Supports Permission granted to share information::  No (pt is disoriented at this time)  Name::     [place]::  [facility] SNF  Relationship::  HCPOA  Contact Information:     Housing/Transportation Living arrangements for the past 2 months:  [place] of Information:  Other (Comment Required) (nephew- Economist) Patient Interpreter Needed:  None Criminal Activity/Legal Involvement Pertinent to Current Situation/Hospitalization:  No - Comment as needed Significant Relationships:  Other Family Members, Neighbor Lives with:  Self Do you feel safe going back to the place where you live?  No Need for family participation in patient care:  Yes (Comment)  Care giving concerns:  Pt lives alone at home and is currently physically and mentally impaired.  Pt only family is his nephew who lives in [place], [place]- pt has a neighbor who is an Therapist, sports but works and is not able to provide much assistance   Facilities manager / plan:  CSW spoke with pt HCPOA (nephew) about Dr recommendation for SNF placement when pt is ready for DC  Employment status:  Retired Forensic scientist:  Medicare PT Recommendations:  Not assessed at this time Information / Referral to community resources:  [place]  Patient/Family's Response to care:  PT HCPOA is agreeable to placement for short term rehab and agrees it isn't safe for pt to return home at this time.  Pt HCPOA would like to send pt to [facility] where pt wife had been for several years prior to her death.  Patient/Family's Understanding of and Emotional Response to Diagnosis, Current Treatment, and Prognosis:  Pt nephew feels somewhat out of the loop  on pt current medical condition due to living in [place] but plans to drive up to [place] this weekend to see the pt and help with patient care  Emotional Assessment Appearance:  Other (Comment Required (did not meet with patient) Attitude/Demeanor/Rapport:  Unable to Assess Affect (typically observed):  Unable to Assess Orientation:  Oriented to Self, Oriented to Place Alcohol / Substance use:  Not Applicable Psych involvement (Current and /or in the community):  No (Comment)  Discharge Needs  Concerns to be addressed:  Discharge Planning Concerns Readmission within the last 30 days:  No Current discharge risk:  Cognitively Impaired, Lack of support system, Lives alone, Physical Impairment Barriers to Discharge:  Continued Medical Work up   Cranford Mon, LCSW 10/25/2014, 4:18 PM

## 2014-10-25 NOTE — Evaluation (Signed)
Clinical/Bedside Swallow Evaluation Patient Details  Name: Finn Amos MRN: [id number] Date of Birth: 1924/03/06  Today's Date: 10/25/2014 Time: SLP Start Time (ACUTE ONLY): 0836 SLP Stop Time (ACUTE ONLY): 0905 SLP Time Calculation (min) (ACUTE ONLY): 29 min  Past Medical History:  Past Medical History  Diagnosis Date  . CAD (coronary artery disease)   . S/P CABG x 4 02/27/1994    LIMA to LAD,SVG to OM,SVG to 2nd OM,SVG to RCA-Dr. Cyndia Bent  . Heart murmur   . Carotid arterial disease     07/04/12 doppler:right ICA 50-69%,left bulb & ICA 0-49%  . Systemic hypertension   . Dyslipidemia   . Bilateral carotid artery stenosis 04/16/2013  . Aortic valve stenosis 04/16/2013    1.5 cm by echo March 2014  . Essential hypertension 04/16/2013    Did not tolerate higher doses of statins. Satisfactory control on once weekly Crestor   . Occasional tremors     mostly right hand  . Enlarged prostate   . GERD (gastroesophageal reflux disease)   . Cancer     oral cancer  . History of shingles 2005  . Hard of hearing   . Hyperthyroidism   . CHF (congestive heart failure)    Past Surgical History:  Past Surgical History  Procedure Laterality Date  . Coronary artery bypass graft  02/27/1994    LIMA to LAD,SVG to OM,SVG to 2nd OM,SVG to RCA  . Intraocular lens insertion  2000  . Cardiac catheterization  02/25/1994  . US echocardiography  07/04/2012    mild to mod AOV stenosis,mitral annular ca+,LA mildly to mod. dilated  . Nm myoview ltd  04/01/2010    no ischemia, EF 60%  . Eye surgery Bilateral     cataract surgery  . Tonsillectomy    . Colonoscopy    . Mouth biopsy  12/13/2013    DR ROSEN  . Floor of mouth biopsy N/A 12/13/2013    Procedure: EXCISION FLOOR OF MOUTH TUMOR/SUBMANDIBULAR DUCT REROUTING/EXCISIONAL BIOPSY OF LYMPHNODE;  Surgeon: Izora Gala, MD;  Location: [place];  Service: ENT;  Laterality: N/A;  . Radical neck dissection Left 04/19/2014    Procedure: LEFT NECK  DISSECTION;  Surgeon: Izora Gala, MD;  Location: [facility];  Service: ENT;  Laterality: Left;   HPI:  Rikki Smestad is a 81 y.o. 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 HTN Hyperlipidemia GERD presents with cough and an abnormal CT scan. Patient states that he had been having increased fatigue and has been doing a lot of sleeping. He states that he lost track of two days so he decided to come into the hospital. Patient had been having a cough.   Assessment / Plan / Recommendation Clinical Impression  Pt demonstrates normal swallow function with no apparent effect from prior lingual or floor of mouth impairment. Speech and breath support excellent. Pt recommended to consume a regular diet with thin liquids. No SLP f/u needed.     Aspiration Risk  Mild    Diet Recommendation Age appropriate regular solids;Thin   Medication Administration: Whole meds with liquid    Other  Recommendations Oral Care Recommendations: Oral care BID   Follow Up Recommendations       Frequency and Duration        Pertinent Vitals/Pain NA    SLP Swallow Goals  Swallow Study Prior Functional Status       General Other Pertinent Information: Oneal Schoenberger is a 81 y.o. 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 HTN Hyperlipidemia GERD presents with cough and an abnormal CT scan. Patient states that he had been having increased fatigue and has been doing a lot of sleeping. He states that he lost track of two days so he decided to come into the hospital. Patient had been having a cough. Type of Study: Bedside swallow  evaluation Previous Swallow Assessment: none in chart Diet Prior to this Study: NPO Temperature Spikes Noted: No Respiratory Status: Room air Behavior/Cognition: Alert;Cooperative;Pleasant mood Oral Cavity - Dentition: Poor condition Self-Feeding Abilities: Able to feed self Patient Positioning: Upright in bed Baseline Vocal Quality: Normal Volitional Cough: Strong Volitional Swallow: Able to elicit    Oral/Motor/Sensory Function Overall Oral Motor/Sensory Function: Appears within functional limits for tasks assessed   Ice Chips     Thin Liquid Thin Liquid: Within functional limits Presentation: Cup;Straw;Self Fed    Nectar Thick Nectar Thick Liquid: Not tested   Honey Thick Honey Thick Liquid: Not tested   Puree Puree: Within functional limits   Solid   GO    Solid: Within functional limits      [facility], MA CCC-SLP [phone]  Lynann Beaver 10/25/2014,9:18 AM

## 2014-10-25 NOTE — Clinical Documentation Improvement (Signed)
ED note states"He appears cachectic."  6/21 Consult note documents height at 5 feet 9 inches, weight 147 pounds, BMI 21.70. Failure to thrive also documented.  Please identify any additional clinical conditions associated with the above and document in your progress note and carry over to the discharge summary.    Possible Clinical Conditions: -Malnutrition (if present, please specify severity and cause if known) -Other condition (please specify) -Failure to thrive only as currently documented -Unable to determine at present  Thank you, Mateo Flow, RN [phone] Clinical Documentation Specialist

## 2014-10-25 NOTE — Progress Notes (Signed)
[facility] PROGRESS NOTE  Austin Ward GXQ:[id number] DOB: 1924/04/10 DOA: 10/22/2014 PCP: [name], MD  Assessment/Plan: 1. 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 Now presenting with lung mass on the chest x-ray.s/p biopsy by IR on 6/22,  + adenocarcinoma by pathology report. Ct head/ab/pelvis no metastasis, Will consult oncology in am. 2. 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 Consider oncology consultation based on the path report of the biopsy. Does report cough, passed swallow eval, now on regular diet and thin liquid. 3. Hypertension- blood pressure control, continue Losartan, Cardizem, metoprolol 4. Coronary artery disease- stable, patient has a history of CABG and is followed by [facility] cardiology as outpatient 5. Acute kidney injury- cr normalized with IV fluids, bun continue elevated, will continue hydration for one more day. 6. Hypokalemia- replace potassium and check BMP in a.m.  Mag 2.1 7. Diabetes mellitus, non insulin dependent- a1c 5.6. continue sliding scale insulin with NovoLog. 8.  confusion, neighbor reported baseline poor memory fatigue, does has some resting tremor, Ct head no acute findings. Consider trial of sinemet. 9. Cough: did present with mild fever, currently no fever, no leukocytosis, passed swallow eval, cough and mild fever likely from cancer. 10. FTT, wphysical therapy eval, snf.  Code Status: Full code Family Communication: No immediate family in [place]. HPOA cell phone [phone]. [place] neiers who is a Therapist, sports work at [employer] day sugery cell [phone] Disposition Plan: snf   Consultants:  IR  Procedures:  Ct guided Lung mass biopsy by IR on 6//22  Antibiotics:  None  HPI/Subjective: 81 y.o. male with history of oral cancer HTN Hyperlipidemia GERD presents with cough and an abnormal CT scan. Patient states that he had been having  increased fatigue and has been doing a lot of sleeping. He states that he lost track of two days so he decided to come into the hospital. Patient had been having a cough. Patient stats that he has had some sputum no hemoptysis noted. He has had no fever noted. Patient states that he has no chest pain. In the ED he had a CT scan done and this shows presence of metastatic disease possibly in the lungs.  Patient is oriented x3, but less confused about situation, speech more coherent today, intermittent dry cough noticed during encounter.  Objective: Filed Vitals:   10/25/14 1635  BP: 124/55  Pulse: 67  Temp: 97.8 F (36.6 C)  Resp: 17    Intake/Output Summary (Last 24 hours) at 10/25/14 1902 Last data filed at 10/25/14 1808  Gross per 24 hour  Intake 2177.5 ml  Output    495 ml  Net 1682.5 ml   Filed Weights   10/22/14 1835 10/23/14 0127 10/24/14 2006  Weight: 73.483 kg (162 lb) 66.679 kg (147 lb) 63.095 kg (139 lb 1.6 oz)    Exam:   General:  Less confused  Cardiovascular: S1-S2 is regular for murmurs auscultated  Respiratory: Clear to auscultation bilaterally, no wheezing  Abdomen: Soft, nontender, no organomegaly  Musculoskeletal: No edema. Extremities noted. No cyanosis or clubbing.   Neuro: confused, mild resting tremor, ataxia, more right lower extremity. Hard of hearing  Data Reviewed: Basic Metabolic Panel:  Recent Labs Lab 10/22/14 2018 10/23/14 0322 10/24/14 0615 10/25/14 0509  NA 140 139 146* 145  K 3.6 3.4* 3.9 3.6  CL 104 105 111 109  CO2 '24 25 25 28  '$ GLUCOSE 124*  134* 111* 130*  BUN 33* 34* 34* 30*  CREATININE 1.37* 1.27* 1.28* 1.14  CALCIUM 9.1 8.7* 9.0 9.2  MG  --   --   --  2.1   Liver Function Tests:  Recent Labs Lab 10/22/14 2018 10/23/14 0322 10/25/14 0509  AST '18 17 18  '$ ALT 10* 10* 12*  ALKPHOS 55 52 49  BILITOT 1.3* 1.3* 0.9  PROT 6.3* 6.0* 5.5*  ALBUMIN 3.4* 3.3* 2.9*   No results for input(s): LIPASE, AMYLASE in the last  168 hours. No results for input(s): AMMONIA in the last 168 hours. CBC:  Recent Labs Lab 10/22/14 2018 10/23/14 0322 10/25/14 0509  WBC 8.5 9.4 7.6  NEUTROABS 7.0  --   --   HGB 15.1 14.7 14.0  HCT 43.3 42.9 41.2  MCV 85.9 86.5 86.2  PLT 225 222 245   Cardiac Enzymes: No results for input(s): CKTOTAL, CKMB, CKMBINDEX, TROPONINI in the last 168 hours. BNP (last 3 results)  Recent Labs  10/22/14 2018  BNP 315.7*    ProBNP (last 3 results) No results for input(s): PROBNP in the last 8760 hours.  CBG:  Recent Labs Lab 10/24/14 1617 10/24/14 2103 10/25/14 0721 10/25/14 1131 10/25/14 1631  GLUCAP 116* 151* 123* 187* 138*    Recent Results (from the past 240 hour(s))  Culture, blood (routine x 2)     Status: None (Preliminary result)   Collection Time: 10/22/14  8:18 PM  Result Value Ref Range Status   Specimen Description BLOOD RIGHT ARM  Final   Special Requests BOTTLES DRAWN AEROBIC AND ANAEROBIC 5CC  Final   Culture NO GROWTH 3 DAYS  Final   Report Status PENDING  Incomplete  Culture, blood (routine x 2)     Status: None (Preliminary result)   Collection Time: 10/22/14  8:26 PM  Result Value Ref Range Status   Specimen Description BLOOD RIGHT HAND  Final   Special Requests BOTTLES DRAWN AEROBIC AND ANAEROBIC 5CC  Final   Culture NO GROWTH 3 DAYS  Final   Report Status PENDING  Incomplete  Urine culture     Status: None   Collection Time: 10/22/14  8:35 PM  Result Value Ref Range Status   Specimen Description URINE, CLEAN CATCH  Final   Special Requests NONE  Final   Culture   Final    MULTIPLE SPECIES PRESENT, SUGGEST RECOLLECTION IF CLINICALLY INDICATED   Report Status 10/24/2014 FINAL  Final     Studies: Ct Abdomen Pelvis Wo Contrast  10/25/2014   CLINICAL DATA:  Weight loss, cancer workup  EXAM: CT ABDOMEN AND PELVIS WITHOUT CONTRAST  TECHNIQUE: Multidetector CT imaging of the abdomen and pelvis was performed following the standard protocol without  IV contrast.  COMPARISON:  Partial comparison to CT chest dated 10/22/2014. PET-CT dated 11/17/2013. CT abdomen pelvis dated 11/08/2013.  FINDINGS: Lower chest: 5.6 cm left lower lobe lung mass, incompletely visualized, suspicious for primary bronchogenic neoplasm versus metastasis. This is better visualized on recent CT chest.  Trace bilateral pleural effusions with associated dependent atelectasis.  Hepatobiliary: Unenhanced liver is unremarkable.  Layering gallstones, without associated inflammatory changes.  Pancreas: Within normal limits.  Spleen: Within normal limits.  Adrenals/Urinary Tract: Adrenal glands are within normal limits.  Bilateral nonobstructing renal calculi, measuring up to 10 mm in the left upper pole (series 201/ image 34).  Bilateral renal cysts, measuring up to 3.5 cm in the left upper pole (series 201/ image 37) and 2.1 cm in the right  upper pole (series 201/ image 35).  No ureteral or bladder calculi.  No hydronephrosis.  High density fluid within the bladder, correlate for hematuria. Dilute excretory contrast related to prior CT/procedure is also possible. Stable herniation of the bladder via a lower ventral hernia (series 201/image 78).  Stomach/Bowel: Stomach is within normal limits.  No evidence of bowel obstruction.  Normal appendix (series 201/ image 59).  Extensive colonic diverticulosis, without evidence of diverticulitis.  Vascular/Lymphatic: Atherosclerotic calcifications of the abdominal aorta and branch vessels.  No suspicious abdominopelvic lymphadenopathy.  Reproductive: Prostatomegaly, with enlargement of the central gland which indents the base of the bladder.  Other: No abdominopelvic ascites.  3.4 x 4.8 cm subcutaneous lesion in the left gluteal region (series 201/image 81), chronic, possibly reflecting a sebaceous cyst.  Musculoskeletal: Degenerative changes of the visualized thoracolumbar spine. No focal osseous lesions.  IMPRESSION: Right lower lobe mass, incompletely  visualized, suspicious for primary bronchogenic neoplasm versus metastasis, better evaluated on recent CT chest.  No findings suspicious for metastatic disease in the abdomen/pelvis.  Cholelithiasis, without associated inflammatory changes.  Nonobstructing bilateral renal calculi, measuring up to 10 mm in the left upper pole. No ureteral or bladder calculi. No hydronephrosis.  High density material in the bladder, correlate for hematuria versus contrast related to prior CT/procedure.  Additional ancillary findings as above.   Electronically Signed   By: Julian Hy M.D.   On: 10/25/2014 08:32   Ct Head Wo Contrast  10/25/2014   CLINICAL DATA:  Acute onset of confusion.  Initial encounter.  EXAM: CT HEAD WITHOUT CONTRAST  TECHNIQUE: Contiguous axial images were obtained from the base of the skull through the vertex without intravenous contrast.  COMPARISON:  PET/CT performed 11/17/2013  FINDINGS: There is no evidence of acute infarction, mass lesion, or intra- or extra-axial hemorrhage on CT.  Prominence of the ventricles and sulci reflects moderate cortical volume loss. Cerebellar atrophy is noted. Scattered periventricular white matter change likely reflects small vessel ischemic microangiopathy.  The brainstem and fourth ventricle are within normal limits. The basal ganglia are unremarkable in appearance. The cerebral hemispheres demonstrate grossly normal gray-white differentiation. No mass effect or midline shift is seen.  There is no evidence of fracture; visualized osseous structures are unremarkable in appearance. The orbits are within normal limits. Mucosal thickening is noted at the maxillary sinuses bilaterally, and at the ethmoid air cells. The remaining paranasal sinuses and mastoid air cells are well-aerated. A 2.2 cm cystic lesion posterior to the left ear is minimally increased in size from 2015 and likely benign; no abnormal FDG uptake was noted on the prior study.  IMPRESSION: 1. No acute  intracranial pathology seen on CT. 2. Moderate cortical volume loss and scattered small vessel ischemic microangiopathy. 3. 2.2 cm cystic lesion posterior to the left ear is minimally increased in size from 2015 and appears to be benign. 4. Mucosal thickening at the maxillary sinuses bilaterally, and at the ethmoid air cells.   Electronically Signed   By: Garald Balding M.D.   On: 10/25/2014 00:10   Ct Biopsy  10/24/2014   CLINICAL DATA:  81 year old male with a history of head and neck carcinoma, with recent admission and evidence of new right lung mass of the right lower lobe.  He has been referred for biopsy.  EXAM: CT-GUIDED BIOPSY right lung mass.  MEDICATIONS AND MEDICAL HISTORY: Versed 1.0 mg, Fentanyl 50 mcg.  Additional Medications: None.  ANESTHESIA/SEDATION: Moderate sedation time: 18 minutes  PROCEDURE: The procedure, risks,  benefits, and alternatives were explained to the patient. Questions regarding the procedure were encouraged and answered. The patient understands and consents to the procedure.  Patient position in right decubitus position with scout CT of the chest performed.  The patient was then prepped and draped in the usual sterile fashion, the skin and subcutaneous tissues overlying the right lower lobe mass were generously infiltrated with 1% lidocaine for local anesthesia. A small stab incision was made with 11 blade scalpel, and using CT guidance, a guide needle was advanced into the mass.  Three separate 18 gauge core biopsy were retrieved.  The needle was then removed.  Final image was stored.  Patient tolerated the procedure well and remained hemodynamically stable throughout.  No complications were encountered and no significant blood loss was encountered.  FINDINGS: The images document guide needle placement within the right lower lobe mass. Post biopsy images demonstrate no pneumothorax or significant hemorrhage.  COMPLICATIONS: None  IMPRESSION: Status post right lower lobe mass  biopsy, with tissue specimen sent to pathology for complete histopathologic analysis.  Signed,  Dulcy Fanny. Earleen Newport, DO  [facility]  [facility]  PLAN: Patient will be observed in right decubitus position or supine position for 3 hours.  Chest x-ray acquired at 2 p.m. to observe for potential pneumothorax development.  The patient will be NPO until completion of the chest x-ray.  After clearance, patient may receive prophylactic anti coagulation for DVT prophylaxis.   Electronically Signed   By: Corrie Mckusick D.O.   On: 10/24/2014 13:09   Dg Chest Port 1 View  10/24/2014   CLINICAL DATA:  Lung mass post biopsy  EXAM: PORTABLE CHEST - 1 VIEW  COMPARISON:  Portable exam 1511 hours compared to 10/22/2014  FINDINGS: Normal heart size post CABG.  Atherosclerotic calcification aorta.  Mediastinal contours and pulmonary vascularity normal.  Bronchitic changes again noted.  4 cm diameter RIGHT perihilar mass again identified, unchanged.  No infiltrate, pleural effusion or pneumothorax.  IMPRESSION: Bronchitic changes with stable appearance of RIGHT perihilar mass.  No pneumothorax post biopsy.   Electronically Signed   By: Lavonia Dana M.D.   On: 10/24/2014 15:18    Scheduled Meds: . diltiazem  180 mg Oral Daily  . docusate sodium  100 mg Oral BID  . finasteride  5 mg Oral Daily  . folic acid  1 mg Oral Daily  . heparin  5,000 Units Subcutaneous 3 times per day  . insulin aspart  0-15 Units Subcutaneous TID WC  . metoprolol succinate  25 mg Oral Daily  . multivitamin with minerals  1 tablet Oral Daily  . pantoprazole  40 mg Oral Daily  . potassium chloride  10 mEq Oral Daily  . propylthiouracil  100 mg Oral BID  . [START ON 10/29/2014] rosuvastatin  10 mg Oral Q Mon  . tamsulosin  0.4 mg Oral QHS  . thiamine  100 mg Oral Daily   Continuous Infusions: . dextrose 5 % and 0.45 % NaCl with KCl 20 mEq/L 75 mL/hr at 10/25/14 1303    Active Problems:   CAD s/p  CABG 1995   Essential hypertension   Dyslipidemia   GERD (gastroesophageal reflux disease)   Cancer of floor of mouth   AKI (acute kidney injury)   Oral cancer   Lung mass    Time spent: 54 min    Rashiya Lofland MD PhD  [facility] Pager [phone]. If 7PM-7AM, please contact night-coverage at [email or web address], password [facility] 10/25/2014,  7:02 PM  LOS: 2 days

## 2014-10-26 ENCOUNTER — Other Ambulatory Visit: Payer: Self-pay | Admitting: Oncology

## 2014-10-26 DIAGNOSIS — R630 Anorexia: Secondary | ICD-10-CM

## 2014-10-26 DIAGNOSIS — Z87891 Personal history of nicotine dependence: Secondary | ICD-10-CM

## 2014-10-26 DIAGNOSIS — C3431 Malignant neoplasm of lower lobe, right bronchus or lung: Secondary | ICD-10-CM

## 2014-10-26 DIAGNOSIS — R4182 Altered mental status, unspecified: Secondary | ICD-10-CM

## 2014-10-26 DIAGNOSIS — Z8589 Personal history of malignant neoplasm of other organs and systems: Secondary | ICD-10-CM

## 2014-10-26 DIAGNOSIS — R634 Abnormal weight loss: Secondary | ICD-10-CM

## 2014-10-26 DIAGNOSIS — I350 Nonrheumatic aortic (valve) stenosis: Secondary | ICD-10-CM

## 2014-10-26 DIAGNOSIS — E876 Hypokalemia: Secondary | ICD-10-CM

## 2014-10-26 LAB — BASIC METABOLIC PANEL
Anion gap: 10 (ref 5–15)
BUN: 21 mg/dL — ABNORMAL HIGH (ref 6–20)
CO2: 23 mmol/L (ref 22–32)
CREATININE: 0.93 mg/dL (ref 0.61–1.24)
Calcium: 8.8 mg/dL — ABNORMAL LOW (ref 8.9–10.3)
Chloride: 109 mmol/L (ref 101–111)
GFR calc Af Amer: >60 mL/min (ref >60)
Glucose, Bld: 134 mg/dL — ABNORMAL HIGH (ref 65–99)
Potassium: 3.8 mmol/L (ref 3.5–5.1)
Sodium: 142 mmol/L (ref 135–145)

## 2014-10-26 LAB — GLUCOSE, CAPILLARY
GLUCOSE-CAPILLARY: 157 mg/dL — AB (ref 65–99)
GLUCOSE-CAPILLARY: 114 mg/dL — AB (ref 65–99)
Glucose-Capillary: 133 mg/dL — ABNORMAL HIGH (ref 65–99)
Glucose-Capillary: 116 mg/dL — ABNORMAL HIGH (ref 65–99)

## 2014-10-26 LAB — PSA: PSA: 1.15 ng/mL (ref 0.00–4.00)

## 2014-10-26 MED ORDER — KCL-LACTATED RINGERS 20 MEQ/L IV SOLN
INTRAVENOUS | Status: AC
  Administered 2014-10-26 – 2014-10-27 (×2): via INTRAVENOUS
  Filled 2014-10-26 (×2): qty 1000

## 2014-10-26 MED ORDER — BOOST PLUS PO LIQD
237.0000 mL | Freq: Three times a day (TID) | ORAL | Status: DC
  Administered 2014-10-26 – 2014-10-28 (×5): 237 mL via ORAL
  Filled 2014-10-26 (×14): qty 237

## 2014-10-26 MED ORDER — GUAIFENESIN ER 600 MG PO TB12
600.0000 mg | ORAL_TABLET | Freq: Two times a day (BID) | ORAL | Status: DC
  Administered 2014-10-26 – 2014-10-28 (×4): 600 mg via ORAL
  Filled 2014-10-26 (×5): qty 1

## 2014-10-26 MED ORDER — BENZONATATE 100 MG PO CAPS
100.0000 mg | ORAL_CAPSULE | Freq: Three times a day (TID) | ORAL | Status: DC | PRN
  Administered 2014-10-26: 100 mg via ORAL
  Filled 2014-10-26 (×2): qty 1

## 2014-10-26 NOTE — Progress Notes (Signed)
Initial Nutrition Assessment  DOCUMENTATION CODES:  Non-severe (moderate) malnutrition in context of chronic illness  INTERVENTION:  Provide Boost Plus po TID, each supplement provides 360 kcal and 14 grams of protein.  Encourage adequate PO intake.  NUTRITION DIAGNOSIS:  Malnutrition related to chronic illness as evidenced by moderate depletions of muscle mass, moderate depletion of body fat.  GOAL:  Patient will meet greater than or equal to 90% of their needs  MONITOR:  PO intake, Supplement acceptance, Weight trends, Labs, I & O's  REASON FOR ASSESSMENT:  Consult Assessment of nutrition requirement/status  ASSESSMENT: Pt with history of oral cancer HTN, Hyperlipidemia, GERD presents with cough and an abnormal CT scan that shows presence of metastatic disease possibly in the lungs.  Pt reports his appetite is "ok". Current meal completion has been varied from 25-100%. Family at bedside. He reports pt has had a decreased appetite over the past 2-3 months, however pt still consumes 2-3 meals a day and consumes Boost 1-2 times daily. Pt reports usual body weight of ~160 lbs. Question accuracy of current weight recorded? Weight has been stable. RD to order Boost to aid in caloric and protein needs. Family member was educated to encouraged the pt to continue Boost consumption at least twice daily.   Nutrition-Focused physical exam completed. Findings are moderate fat depletion, moderate muscle depletion, and no edema.   Labs and medications reviewed.   Height:  Ht Readings from Last 1 Encounters:  10/23/14 '5\' 9"'$  (1.753 m)    Weight:  Wt Readings from Last 1 Encounters:  10/24/14 139 lb 1.6 oz (63.095 kg)    Ideal Body Weight:  72.7 kg  Wt Readings from Last 10 Encounters:  10/24/14 139 lb 1.6 oz (63.095 kg)  10/05/14 161 lb (73.029 kg)  04/19/14 170 lb 4 oz (77.225 kg)  12/13/13 170 lb 6.7 oz (77.3 kg)  12/08/13 170 lb 6.7 oz (77.3 kg)  09/22/13 172 lb 6.4 oz  (78.2 kg)  09/11/13 173 lb (78.472 kg)  07/31/13 173 lb (78.472 kg)  07/21/13 168 lb 1.6 oz (76.25 kg)  06/14/13 175 lb (79.379 kg)    BMI:  Body mass index is 20.53 kg/(m^2).  Estimated Nutritional Needs:  Kcal:  1900-2100  Protein:  90-105 grams  Fluid:  1.9 - 2.1 L/day  Skin:  Reviewed, no issues  Diet Order:  Diet regular Room service appropriate?: Yes; Fluid consistency:: Thin  EDUCATION NEEDS:  Education needs addressed   Intake/Output Summary (Last 24 hours) at 10/26/14 1345 Last data filed at 10/26/14 0500  Gross per 24 hour  Intake    240 ml  Output    325 ml  Net    -85 ml    Last BM:  6/24  Corrin Parker, MS, RD, LDN Pager # [phone] After hours/ weekend pager # [phone]

## 2014-10-26 NOTE — Progress Notes (Signed)
[facility] PROGRESS NOTE  Evert Wenrich XNA:[id number] DOB: Jan 11, 1924 DOA: 10/22/2014 PCP: [name], MD  Assessment/Plan: 1. 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 Now presenting with lung mass on the chest x-ray.s/p biopsy by IR on 6/22,  + adenocarcinoma by pathology report. Ct head/ab/pelvis no metastasis,  oncology Dr. Benay Spice consulted, will see patient today. 2. 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 Consider oncology consultation based on the path report of the biopsy. Does report cough, passed swallow eval, now on regular diet and thin liquid. 3. Hypertension- blood pressure control, continue Losartan, Cardizem, metoprolol 4. Coronary artery disease- stable, patient has a history of CABG and is followed by [facility] cardiology as outpatient 5. Acute kidney injury- cr normalized with IV fluids, bun continue elevated, will continue hydration for one more day. ivf changed to LR with k. 6. Hypokalemia- replace potassium.  Mag 2.1 7. Diabetes mellitus, non insulin dependent- a1c 5.6. continue sliding scale insulin with NovoLog. 8.  confusion, improving, oriented x3, but not remembering about the pathology result I disclosed to him yesterday evening. neighbor reported baseline poor memory,  does has some resting tremor, Ct head no acute findings. Consider trial of sinemet. 9. Cough: did present with mild fever, currently no fever, no leukocytosis, passed swallow eval, cough and mild fever likely from cancer. 10. FTT, physical therapy eval, snf.  Code Status: Full code Family Communication: No immediate family in [place]. HPOA cell phone [phone]. Luther neiers who is a Therapist, sports work at [employer] day sugery cell [phone] Disposition Plan: snf   Consultants:  IR  oncology  Procedures:  Ct guided Lung mass biopsy by IR on 6//22  Antibiotics:  None  HPI/Subjective: 81 y.o.  male with history of oral cancer HTN Hyperlipidemia GERD presents with cough and an abnormal CT scan. Patient states that he had been having increased fatigue and has been doing a lot of sleeping. He states that he lost track of two days so he decided to come into the hospital. Patient had been having a cough. Patient stats that he has had some sputum no hemoptysis noted. He has had no fever noted. Patient states that he has no chest pain. In the ED he had a CT scan done and this shows presence of metastatic disease possibly in the lungs.  Patient is oriented x3, less confused about situation, speech more coherent today, intermittent dry cough noticed during encounter. Very poor memory, immediate recall 2/3, not able to remember the cancer diagnosis i disclosed to him yesterday evening. Neighbor Merrilee Seashore in room, under patient 's permission, i discussed with him about the cancer diagnosis with nick in room. Patient currently prefers me not calling HPOA.  Objective: Filed Vitals:   10/26/14 0841  BP: 128/55  Pulse: 62  Temp: 98.6 F (37 C)  Resp: 18    Intake/Output Summary (Last 24 hours) at 10/26/14 1212 Last data filed at 10/26/14 0500  Gross per 24 hour  Intake    600 ml  Output    375 ml  Net    225 ml   Filed Weights   10/22/14 1835 10/23/14 0127 10/24/14 2006  Weight: 73.483 kg (162 lb) 66.679 kg (147 lb) 63.095 kg (139 lb 1.6 oz)    Exam:   General:  Less confused  Cardiovascular: S1-S2 is regular for murmurs auscultated  Respiratory: Clear to auscultation bilaterally, no wheezing  Abdomen:  Soft, nontender, no organomegaly  Musculoskeletal: No edema. Extremities noted. No cyanosis or clubbing.   Neuro: confused, mild resting tremor, ataxia, more right lower extremity. Hard of hearing  Data Reviewed: Basic Metabolic Panel:  Recent Labs Lab 10/22/14 2018 10/23/14 0322 10/24/14 0615 10/25/14 0509 10/26/14 0329  NA 140 139 146* 145 142  K 3.6 3.4* 3.9 3.6 3.8  CL  104 105 111 109 109  CO2 '24 25 25 28 23  '$ GLUCOSE 124* 134* 111* 130* 134*  BUN 33* 34* 34* 30* 21*  CREATININE 1.37* 1.27* 1.28* 1.14 0.93  CALCIUM 9.1 8.7* 9.0 9.2 8.8*  MG  --   --   --  2.1  --    Liver Function Tests:  Recent Labs Lab 10/22/14 2018 10/23/14 0322 10/25/14 0509  AST '18 17 18  '$ ALT 10* 10* 12*  ALKPHOS 55 52 49  BILITOT 1.3* 1.3* 0.9  PROT 6.3* 6.0* 5.5*  ALBUMIN 3.4* 3.3* 2.9*   No results for input(s): LIPASE, AMYLASE in the last 168 hours. No results for input(s): AMMONIA in the last 168 hours. CBC:  Recent Labs Lab 10/22/14 2018 10/23/14 0322 10/25/14 0509  WBC 8.5 9.4 7.6  NEUTROABS 7.0  --   --   HGB 15.1 14.7 14.0  HCT 43.3 42.9 41.2  MCV 85.9 86.5 86.2  PLT 225 222 245   Cardiac Enzymes: No results for input(s): CKTOTAL, CKMB, CKMBINDEX, TROPONINI in the last 168 hours. BNP (last 3 results)  Recent Labs  10/22/14 2018  BNP 315.7*    ProBNP (last 3 results) No results for input(s): PROBNP in the last 8760 hours.  CBG:  Recent Labs Lab 10/25/14 1131 10/25/14 1631 10/25/14 2107 10/26/14 0731 10/26/14 1143  GLUCAP 187* 138* 124* 133* 157*    Recent Results (from the past 240 hour(s))  Culture, blood (routine x 2)     Status: None (Preliminary result)   Collection Time: 10/22/14  8:18 PM  Result Value Ref Range Status   Specimen Description BLOOD RIGHT ARM  Final   Special Requests BOTTLES DRAWN AEROBIC AND ANAEROBIC 5CC  Final   Culture NO GROWTH 3 DAYS  Final   Report Status PENDING  Incomplete  Culture, blood (routine x 2)     Status: None (Preliminary result)   Collection Time: 10/22/14  8:26 PM  Result Value Ref Range Status   Specimen Description BLOOD RIGHT HAND  Final   Special Requests BOTTLES DRAWN AEROBIC AND ANAEROBIC 5CC  Final   Culture NO GROWTH 3 DAYS  Final   Report Status PENDING  Incomplete  Urine culture     Status: None   Collection Time: 10/22/14  8:35 PM  Result Value Ref Range Status    Specimen Description URINE, CLEAN CATCH  Final   Special Requests NONE  Final   Culture   Final    MULTIPLE SPECIES PRESENT, SUGGEST RECOLLECTION IF CLINICALLY INDICATED   Report Status 10/24/2014 FINAL  Final     Studies: Ct Abdomen Pelvis Wo Contrast  10/25/2014   CLINICAL DATA:  Weight loss, cancer workup  EXAM: CT ABDOMEN AND PELVIS WITHOUT CONTRAST  TECHNIQUE: Multidetector CT imaging of the abdomen and pelvis was performed following the standard protocol without IV contrast.  COMPARISON:  Partial comparison to CT chest dated 10/22/2014. PET-CT dated 11/17/2013. CT abdomen pelvis dated 11/08/2013.  FINDINGS: Lower chest: 5.6 cm left lower lobe lung mass, incompletely visualized, suspicious for primary bronchogenic neoplasm versus metastasis. This is better visualized on  recent CT chest.  Trace bilateral pleural effusions with associated dependent atelectasis.  Hepatobiliary: Unenhanced liver is unremarkable.  Layering gallstones, without associated inflammatory changes.  Pancreas: Within normal limits.  Spleen: Within normal limits.  Adrenals/Urinary Tract: Adrenal glands are within normal limits.  Bilateral nonobstructing renal calculi, measuring up to 10 mm in the left upper pole (series 201/ image 34).  Bilateral renal cysts, measuring up to 3.5 cm in the left upper pole (series 201/ image 37) and 2.1 cm in the right upper pole (series 201/ image 35).  No ureteral or bladder calculi.  No hydronephrosis.  High density fluid within the bladder, correlate for hematuria. Dilute excretory contrast related to prior CT/procedure is also possible. Stable herniation of the bladder via a lower ventral hernia (series 201/image 78).  Stomach/Bowel: Stomach is within normal limits.  No evidence of bowel obstruction.  Normal appendix (series 201/ image 59).  Extensive colonic diverticulosis, without evidence of diverticulitis.  Vascular/Lymphatic: Atherosclerotic calcifications of the abdominal aorta and branch  vessels.  No suspicious abdominopelvic lymphadenopathy.  Reproductive: Prostatomegaly, with enlargement of the central gland which indents the base of the bladder.  Other: No abdominopelvic ascites.  3.4 x 4.8 cm subcutaneous lesion in the left gluteal region (series 201/image 81), chronic, possibly reflecting a sebaceous cyst.  Musculoskeletal: Degenerative changes of the visualized thoracolumbar spine. No focal osseous lesions.  IMPRESSION: Right lower lobe mass, incompletely visualized, suspicious for primary bronchogenic neoplasm versus metastasis, better evaluated on recent CT chest.  No findings suspicious for metastatic disease in the abdomen/pelvis.  Cholelithiasis, without associated inflammatory changes.  Nonobstructing bilateral renal calculi, measuring up to 10 mm in the left upper pole. No ureteral or bladder calculi. No hydronephrosis.  High density material in the bladder, correlate for hematuria versus contrast related to prior CT/procedure.  Additional ancillary findings as above.   Electronically Signed   By: Julian Hy M.D.   On: 10/25/2014 08:32   Ct Head Wo Contrast  10/25/2014   CLINICAL DATA:  Acute onset of confusion.  Initial encounter.  EXAM: CT HEAD WITHOUT CONTRAST  TECHNIQUE: Contiguous axial images were obtained from the base of the skull through the vertex without intravenous contrast.  COMPARISON:  PET/CT performed 11/17/2013  FINDINGS: There is no evidence of acute infarction, mass lesion, or intra- or extra-axial hemorrhage on CT.  Prominence of the ventricles and sulci reflects moderate cortical volume loss. Cerebellar atrophy is noted. Scattered periventricular white matter change likely reflects small vessel ischemic microangiopathy.  The brainstem and fourth ventricle are within normal limits. The basal ganglia are unremarkable in appearance. The cerebral hemispheres demonstrate grossly normal gray-white differentiation. No mass effect or midline shift is seen.  There  is no evidence of fracture; visualized osseous structures are unremarkable in appearance. The orbits are within normal limits. Mucosal thickening is noted at the maxillary sinuses bilaterally, and at the ethmoid air cells. The remaining paranasal sinuses and mastoid air cells are well-aerated. A 2.2 cm cystic lesion posterior to the left ear is minimally increased in size from 2015 and likely benign; no abnormal FDG uptake was noted on the prior study.  IMPRESSION: 1. No acute intracranial pathology seen on CT. 2. Moderate cortical volume loss and scattered small vessel ischemic microangiopathy. 3. 2.2 cm cystic lesion posterior to the left ear is minimally increased in size from 2015 and appears to be benign. 4. Mucosal thickening at the maxillary sinuses bilaterally, and at the ethmoid air cells.   Electronically Signed  By: Garald Balding M.D.   On: 10/25/2014 00:10   Ct Biopsy  10/24/2014   CLINICAL DATA:  81 year old male with a history of head and neck carcinoma, with recent admission and evidence of new right lung mass of the right lower lobe.  He has been referred for biopsy.  EXAM: CT-GUIDED BIOPSY right lung mass.  MEDICATIONS AND MEDICAL HISTORY: Versed 1.0 mg, Fentanyl 50 mcg.  Additional Medications: None.  ANESTHESIA/SEDATION: Moderate sedation time: 18 minutes  PROCEDURE: The procedure, risks, benefits, and alternatives were explained to the patient. Questions regarding the procedure were encouraged and answered. The patient understands and consents to the procedure.  Patient position in right decubitus position with scout CT of the chest performed.  The patient was then prepped and draped in the usual sterile fashion, the skin and subcutaneous tissues overlying the right lower lobe mass were generously infiltrated with 1% lidocaine for local anesthesia. A small stab incision was made with 11 blade scalpel, and using CT guidance, a guide needle was advanced into the mass.  Three separate 18 gauge  core biopsy were retrieved.  The needle was then removed.  Final image was stored.  Patient tolerated the procedure well and remained hemodynamically stable throughout.  No complications were encountered and no significant blood loss was encountered.  FINDINGS: The images document guide needle placement within the right lower lobe mass. Post biopsy images demonstrate no pneumothorax or significant hemorrhage.  COMPLICATIONS: None  IMPRESSION: Status post right lower lobe mass biopsy, with tissue specimen sent to pathology for complete histopathologic analysis.  Signed,  Dulcy Fanny. Earleen Newport, DO  [facility]  [facility]  PLAN: Patient will be observed in right decubitus position or supine position for 3 hours.  Chest x-ray acquired at 2 p.m. to observe for potential pneumothorax development.  The patient will be NPO until completion of the chest x-ray.  After clearance, patient may receive prophylactic anti coagulation for DVT prophylaxis.   Electronically Signed   By: Corrie Mckusick D.O.   On: 10/24/2014 13:09   Dg Chest Port 1 View  10/24/2014   CLINICAL DATA:  Lung mass post biopsy  EXAM: PORTABLE CHEST - 1 VIEW  COMPARISON:  Portable exam 1511 hours compared to 10/22/2014  FINDINGS: Normal heart size post CABG.  Atherosclerotic calcification aorta.  Mediastinal contours and pulmonary vascularity normal.  Bronchitic changes again noted.  4 cm diameter RIGHT perihilar mass again identified, unchanged.  No infiltrate, pleural effusion or pneumothorax.  IMPRESSION: Bronchitic changes with stable appearance of RIGHT perihilar mass.  No pneumothorax post biopsy.   Electronically Signed   By: Lavonia Dana M.D.   On: 10/24/2014 15:18    Scheduled Meds: . diltiazem  180 mg Oral Daily  . docusate sodium  100 mg Oral BID  . finasteride  5 mg Oral Daily  . folic acid  1 mg Oral Daily  . heparin  5,000 Units Subcutaneous 3 times per day  . insulin aspart  0-15 Units  Subcutaneous TID WC  . metoprolol succinate  25 mg Oral Daily  . multivitamin with minerals  1 tablet Oral Daily  . pantoprazole  40 mg Oral Daily  . potassium chloride  10 mEq Oral Daily  . propylthiouracil  100 mg Oral BID  . [START ON 10/29/2014] rosuvastatin  10 mg Oral Q Mon  . tamsulosin  0.4 mg Oral QHS  . thiamine  100 mg Oral Daily   Continuous Infusions: . lactated ringers  with KCl 20 mEq/L      Active Problems:   CAD s/p CABG 1995   Essential hypertension   Dyslipidemia   GERD (gastroesophageal reflux disease)   Cancer of floor of mouth   AKI (acute kidney injury)   Oral cancer   Lung mass    Time spent: 62 min    Iriana Artley MD PhD  [facility] Pager [phone]. If 7PM-7AM, please contact night-coverage at [email or web address], password [facility] 10/26/2014, 12:12 PM  LOS: 3 days

## 2014-10-26 NOTE — Care Management Note (Signed)
Case Management Note  Patient Details  Name: Austin Ward MRN: [id number] Date of Birth: 1924-02-07  Subjective/Objective:               CM following for progression and d/c planning.     Action/Plan: 10/26/14 Met with pt and discussed d/c plans, CSW working with pt . Plan is for short term rehab at [facility]. Anticipate d/c in next 48 hr.  Expected Discharge Date:        10/28/2014          Expected Discharge Plan:  [name]  In-House Referral:  Clinical Social Work  Discharge planning Services  NA  Post Acute Care Choice:  NA Choice offered to:  NA  DME Arranged:    DME Agency:     HH Arranged:    [place] Agency:     Status of Service:  Completed, signed off  Medicare Important Message Given:  Yes Date Medicare IM Given:  10/26/14 Medicare IM give by:  Jasmine Pang RN MPH, case manager, [phone] Date Additional Medicare IM Given:    Additional Medicare Important Message give by:     If discussed at [place] of Stay Meetings, dates discussed:    Additional Comments:  Adron Bene, RN 10/26/2014, 5:28 PM

## 2014-10-26 NOTE — Progress Notes (Addendum)
[name] Patient Consult   Referring MD: Dr.Xu    Austin Ward 81 y.o.  1923/08/21    Reason for Referral: Non-small cell lung cancer  HPI: He was diagnosed with head and neck cancer in 2015. He underwent a floor the mouth resection and right submandibular lymph node biopsy on 12/13/2013. A right submandibular lymph node biopsy on 04/19/2014 was benign.  He was admitted 10/22/2014 after being found at home by his neighbor. He reports not leaving the house for the previous 2-3 days. He is not aware of the circumstances of these days other than staying in bed. He reports malaise and anorexia. No other complaint. He was noted to report a productive cough in the emergency room.  A chest x-ray 10/22/2014 revealed a 4.3 cm right lower lobe mass. A CT confirmed a 5.37 m subpleural right lower lobe mass with central lucency, new compared to a PET scan from 11/17/2013.  He was taken to a CT-guided biopsy of the right lower lobe mass on 10/24/2014. The pathology revealed adenocarcinoma.    Past Medical History  Diagnosis Date  . CAD (coronary artery disease)   . S/P CABG x 4 02/27/1994    LIMA to LAD,SVG to OM,SVG to 2nd OM,SVG to RCA-Dr. Cyndia Bent  . Heart murmur   . Carotid arterial disease     07/04/12 doppler:right ICA 50-69%,left bulb & ICA 0-49%  . Systemic hypertension   . Dyslipidemia   . Bilateral carotid artery stenosis 04/16/2013  . Aortic valve stenosis 04/16/2013    1.5 cm by echo March 2014  . Essential hypertension 04/16/2013    Did not tolerate higher doses of statins. Satisfactory control on once weekly Crestor   . Occasional tremors     mostly right hand  . Enlarged prostate   . GERD (gastroesophageal reflux disease)   . Cancer     oral cancer  . History of shingles 2005  . Hard of hearing   . Hyperthyroidism   . CHF (congestive heart failure)     Past Surgical History  Procedure Laterality Date  . Coronary artery bypass graft   02/27/1994    LIMA to LAD,SVG to OM,SVG to 2nd OM,SVG to RCA  . Intraocular lens insertion  2000  . Cardiac catheterization  02/25/1994  . US echocardiography  07/04/2012    mild to mod AOV stenosis,mitral annular ca+,LA mildly to mod. dilated  . Nm myoview ltd  04/01/2010    no ischemia, EF 60%  . Eye surgery Bilateral     cataract surgery  . Tonsillectomy    . Colonoscopy    . Mouth biopsy  12/13/2013    DR ROSEN  . Floor of mouth biopsy N/A 12/13/2013    Procedure: EXCISION FLOOR OF MOUTH TUMOR/SUBMANDIBULAR DUCT REROUTING/EXCISIONAL BIOPSY OF LYMPHNODE;  Surgeon: Izora Gala, MD;  Location: [facility];  Service: ENT;  Laterality: N/A;  . Radical neck dissection Left 04/19/2014    Procedure: LEFT NECK DISSECTION;  Surgeon: Izora Gala, MD;  Location: [facility];  Service: ENT;  Laterality: Left;    Medications: Reviewed  Allergies:  Allergies  Allergen Reactions  . Ace Inhibitors Other (See Comments)    Intolerance. Reaction unknown.  . Lipitor [Atorvastatin] Other (See Comments)    Leg weakness    Family history: Noncontributory  Social History:   He lives alone in [place]. He is retired from [employer]. He has no family locally. He does not use alcohol or cigarettes at present.  He has a remote history of cigarette use. He received a "transfusion "in the past. He cannot be more specific.  History  Alcohol Use No    History  Smoking status  . Former Smoker  . Quit date: 05/04/1971  Smokeless tobacco  . Never Used      ROS:   Positives include: Anorexia, 40 pound weight loss over the past 6 months, malaise  A complete ROS was otherwise negative.  Physical Exam:  Blood pressure 128/55, pulse 62, temperature 98.6 F (37 C), temperature source Oral, resp. rate 18, height $RemoveBe'5\' 9"'mtSUVrYXs$  (1.753 m), weight 139 lb 1.6 oz (63.095 kg), SpO2 95 %.  HEENT: Subungual biopsy site, oral cavity without visible mass, neck without mass Lungs: Inspiratory rhonchi at the lower  posterior chest bilaterally, no respiratory distress Cardiac: Regular rate and rhythm Abdomen: No hepatosplenic megaly, no mass, reducible low ventral hernia GU: Uncircumcised male, testes without mass  Vascular: No leg edema Lymph nodes: No cervical, supra-clavicular, axillary, or inguinal nodes Neurologic: Alert, oriented to year, follows commands Skin: Right neck cyst, no rash Musculoskeletal: No spine tenderness   LAB:  CBC  Lab Results  Component Value Date   WBC 7.6 10/25/2014   HGB 14.0 10/25/2014   HCT 41.2 10/25/2014   MCV 86.2 10/25/2014   PLT 245 10/25/2014   NEUTROABS 7.0 10/22/2014     CMP      Component Value Date/Time   NA 142 10/26/2014 0329   K 3.8 10/26/2014 0329   CL 109 10/26/2014 0329   CO2 23 10/26/2014 0329   GLUCOSE 134* 10/26/2014 0329   BUN 21* 10/26/2014 0329   CREATININE 0.93 10/26/2014 0329   CREATININE 1.20 10/09/2014 0939   CALCIUM 8.8* 10/26/2014 0329   PROT 5.5* 10/25/2014 0509   ALBUMIN 2.9* 10/25/2014 0509   AST 18 10/25/2014 0509   ALT 12* 10/25/2014 0509   ALKPHOS 49 10/25/2014 0509   BILITOT 0.9 10/25/2014 0509   GFRNONAA >60 10/26/2014 0329   GFRAA >60 10/26/2014 0329    Imaging:  Ct Abdomen Pelvis Wo Contrast  10/25/2014   CLINICAL DATA:  Weight loss, cancer workup  EXAM: CT ABDOMEN AND PELVIS WITHOUT CONTRAST  TECHNIQUE: Multidetector CT imaging of the abdomen and pelvis was performed following the standard protocol without IV contrast.  COMPARISON:  Partial comparison to CT chest dated 10/22/2014. PET-CT dated 11/17/2013. CT abdomen pelvis dated 11/08/2013.  FINDINGS: Lower chest: 5.6 cm left lower lobe lung mass, incompletely visualized, suspicious for primary bronchogenic neoplasm versus metastasis. This is better visualized on recent CT chest.  Trace bilateral pleural effusions with associated dependent atelectasis.  Hepatobiliary: Unenhanced liver is unremarkable.  Layering gallstones, without associated inflammatory  changes.  Pancreas: Within normal limits.  Spleen: Within normal limits.  Adrenals/Urinary Tract: Adrenal glands are within normal limits.  Bilateral nonobstructing renal calculi, measuring up to 10 mm in the left upper pole (series 201/ image 34).  Bilateral renal cysts, measuring up to 3.5 cm in the left upper pole (series 201/ image 37) and 2.1 cm in the right upper pole (series 201/ image 35).  No ureteral or bladder calculi.  No hydronephrosis.  High density fluid within the bladder, correlate for hematuria. Dilute excretory contrast related to prior CT/procedure is also possible. Stable herniation of the bladder via a lower ventral hernia (series 201/image 78).  Stomach/Bowel: Stomach is within normal limits.  No evidence of bowel obstruction.  Normal appendix (series 201/ image 59).  Extensive colonic diverticulosis, without evidence  of diverticulitis.  Vascular/Lymphatic: Atherosclerotic calcifications of the abdominal aorta and branch vessels.  No suspicious abdominopelvic lymphadenopathy.  Reproductive: Prostatomegaly, with enlargement of the central gland which indents the base of the bladder.  Other: No abdominopelvic ascites.  3.4 x 4.8 cm subcutaneous lesion in the left gluteal region (series 201/image 81), chronic, possibly reflecting a sebaceous cyst.  Musculoskeletal: Degenerative changes of the visualized thoracolumbar spine. No focal osseous lesions.  IMPRESSION: Right lower lobe mass, incompletely visualized, suspicious for primary bronchogenic neoplasm versus metastasis, better evaluated on recent CT chest.  No findings suspicious for metastatic disease in the abdomen/pelvis.  Cholelithiasis, without associated inflammatory changes.  Nonobstructing bilateral renal calculi, measuring up to 10 mm in the left upper pole. No ureteral or bladder calculi. No hydronephrosis.  High density material in the bladder, correlate for hematuria versus contrast related to prior CT/procedure.  Additional  ancillary findings as above.   Electronically Signed   By: Julian Hy M.D.   On: 10/25/2014 08:32   Ct Head Wo Contrast  10/25/2014   CLINICAL DATA:  Acute onset of confusion.  Initial encounter.  EXAM: CT HEAD WITHOUT CONTRAST  TECHNIQUE: Contiguous axial images were obtained from the base of the skull through the vertex without intravenous contrast.  COMPARISON:  PET/CT performed 11/17/2013  FINDINGS: There is no evidence of acute infarction, mass lesion, or intra- or extra-axial hemorrhage on CT.  Prominence of the ventricles and sulci reflects moderate cortical volume loss. Cerebellar atrophy is noted. Scattered periventricular white matter change likely reflects small vessel ischemic microangiopathy.  The brainstem and fourth ventricle are within normal limits. The basal ganglia are unremarkable in appearance. The cerebral hemispheres demonstrate grossly normal gray-white differentiation. No mass effect or midline shift is seen.  There is no evidence of fracture; visualized osseous structures are unremarkable in appearance. The orbits are within normal limits. Mucosal thickening is noted at the maxillary sinuses bilaterally, and at the ethmoid air cells. The remaining paranasal sinuses and mastoid air cells are well-aerated. A 2.2 cm cystic lesion posterior to the left ear is minimally increased in size from 2015 and likely benign; no abnormal FDG uptake was noted on the prior study.  IMPRESSION: 1. No acute intracranial pathology seen on CT. 2. Moderate cortical volume loss and scattered small vessel ischemic microangiopathy. 3. 2.2 cm cystic lesion posterior to the left ear is minimally increased in size from 2015 and appears to be benign. 4. Mucosal thickening at the maxillary sinuses bilaterally, and at the ethmoid air cells.   Electronically Signed   By: Garald Balding M.D.   On: 10/25/2014 00:10      Assessment/Plan:   1. Non-small cell lung cancer  Right lower lobe mass with a biopsy  10/24/2014 confirming adenocarcinoma 2. History of head and neck cancer 3. History of coronary disease 4. Hearing loss 5. Admission with altered mental status-etiology unclear 6. Anorexia/weight loss, likely secondary to non-small cell lung cancer 7. Aortic stenosis 8. History of hyperthyroidism   Disposition:   Mr.Alemany has been diagnosed with adenocarcinoma involving a right lung mass. He appears to have a primary non-small cell lung cancer. There is no clinical or CT evidence of distant metastatic disease. This appears unrelated to the head and neck cancer from 2015.  I discussed treatment options with him. He may not be a candidate for primary surgery given his history of coronary artery disease, aortic stenosis, and advanced age. We will consider palliative radiation. I will request EGFR and ALK testing  given the remote history of tobacco use.  I will arrange for outpatient [facility] at [facility]. I will try to get him scheduled for the multidisciplinary thoracic oncology clinic within the next 2 weeks. I will also request an outpatient PET scan (ordered).  I will check his PSA. He apparently has a history of hyperthyroidism. Monitor thyroid function studies per medical service.  Please call oncology as needed while he is in the hospital. I will check on him 10/29/2014 if he remains in the hospital.  [facility], Dominica Severin 10/26/2014, 4:04 PM

## 2014-10-27 DIAGNOSIS — E440 Moderate protein-calorie malnutrition: Secondary | ICD-10-CM

## 2014-10-27 LAB — CULTURE, BLOOD (ROUTINE X 2)
CULTURE: NO GROWTH
CULTURE: NO GROWTH

## 2014-10-27 LAB — GLUCOSE, CAPILLARY
GLUCOSE-CAPILLARY: 98 mg/dL (ref 65–99)
GLUCOSE-CAPILLARY: 139 mg/dL — AB (ref 65–99)
Glucose-Capillary: 98 mg/dL (ref 65–99)
Glucose-Capillary: 118 mg/dL — ABNORMAL HIGH (ref 65–99)

## 2014-10-27 MED ORDER — SENNOSIDES-DOCUSATE SODIUM 8.6-50 MG PO TABS
1.0000 | ORAL_TABLET | Freq: Every day | ORAL | Status: DC
  Administered 2014-10-27: 1 via ORAL
  Filled 2014-10-27: qty 1

## 2014-10-27 NOTE — Progress Notes (Signed)
[facility] PROGRESS NOTE  Austin Ward FOY:[id number] DOB: Jun 15, 1923 DOA: 10/22/2014 PCP: [name], MD  Assessment/Plan: 1. 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 Now presenting with lung mass on the chest x-ray.s/p biopsy by IR on 6/22,  + adenocarcinoma by pathology report. Ct head/ab/pelvis no metastasis,  Appreciate oncology Dr. Benay Spice input. 2. 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 Does report cough, passed swallow eval, now on regular diet and thin liquid. 3. Hypertension- blood pressure control, continue metoprolol, d/c cardizem. 4. Coronary artery disease- stable, patient has a history of CABG and is followed by [facility] cardiology as outpatient 5. Acute kidney injury- cr normalized with IV fluids, ivf stopped on 6/25, encourage oral intake. D/c maxzide. 6. Hypokalemia- replace potassium daily,  Mag 2.1 7. Diabetes mellitus, non insulin dependent- a1c 5.6. D/c sliding scale insulin with NovoLog. Diet modification. Not a good candidate for metformin due to fluctuating renal function. 8. H/o hyperthyroidism: on PTU, tsh wnl. 9.  confusion, improving, oriented x3, Ct head no acute findings.  10. Cough: did present with mild fever, currently no fever, no leukocytosis, passed swallow eval, cough and mild fever likely from cancer. 11. FTT, physical therapy eval, snf.  Code Status: Full code Family Communication: No immediate family in [place]. HPOA cell phone [phone]. [place] neiers who is a Therapist, sports work at [employer] cell [phone] Disposition Plan: snf   Consultants:  IR  oncology  Procedures:  Ct guided Lung mass biopsy by IR on 6//22  Antibiotics:  None  HPI/Subjective: 81 y.o. male with history of oral cancer HTN Hyperlipidemia GERD presents with cough and an abnormal CT scan. Patient states that he had been having increased fatigue and has been  doing a lot of sleeping. He states that he lost track of two days so he decided to come into the hospital. Patient had been having a cough. Patient stats that he has had some sputum no hemoptysis noted. He has had no fever noted. Patient states that he has no chest pain. In the ED he had a CT scan done and this shows presence of metastatic disease possibly in the lungs.  Patient is oriented x3, less confused about situation, speech more coherent today, intermittent dry cough noticed during encounter. Improved mentation, remembering seen by oncology and seems understand the plan of care today.  Objective: Filed Vitals:   10/27/14 0944  BP: 119/53  Pulse: 65  Temp: 98.7 F (37.1 C)  Resp: 18    Intake/Output Summary (Last 24 hours) at 10/27/14 1722 Last data filed at 10/27/14 1334  Gross per 24 hour  Intake   1700 ml  Output    850 ml  Net    850 ml   Filed Weights   10/24/14 2006 10/26/14 2126 10/27/14 0726  Weight: 63.095 kg (139 lb 1.6 oz) 68.493 kg (151 lb) 68.7 kg (151 lb 7.3 oz)    Exam:   General:  Less confused  Cardiovascular: S1-S2 is regular for murmurs auscultated  Respiratory: Clear to auscultation bilaterally, no wheezing  Abdomen: Soft, nontender, no organomegaly  Musculoskeletal: No edema. Extremities noted. No cyanosis or clubbing.   Neuro: less confused, mild resting tremor, ataxia, more right lower extremity. Hard of hearing  Data Reviewed: Basic Metabolic Panel:  Recent Labs Lab 10/22/14 2018 10/23/14 0322 10/24/14 0615 10/25/14 0509 10/26/14 0329  NA 140 139 146* 145 142  K 3.6 3.4* 3.9 3.6 3.8  CL 104 105 111 109 109  CO2 '24 25 25 28 23  '$ GLUCOSE 124* 134* 111* 130* 134*  BUN 33* 34* 34* 30* 21*  CREATININE 1.37* 1.27* 1.28* 1.14 0.93  CALCIUM 9.1 8.7* 9.0 9.2 8.8*  MG  --   --   --  2.1  --    Liver Function Tests:  Recent Labs Lab 10/22/14 2018 10/23/14 0322 10/25/14 0509  AST '18 17 18  '$ ALT 10* 10* 12*  ALKPHOS 55 52 49   BILITOT 1.3* 1.3* 0.9  PROT 6.3* 6.0* 5.5*  ALBUMIN 3.4* 3.3* 2.9*   No results for input(s): LIPASE, AMYLASE in the last 168 hours. No results for input(s): AMMONIA in the last 168 hours. CBC:  Recent Labs Lab 10/22/14 2018 10/23/14 0322 10/25/14 0509  WBC 8.5 9.4 7.6  NEUTROABS 7.0  --   --   HGB 15.1 14.7 14.0  HCT 43.3 42.9 41.2  MCV 85.9 86.5 86.2  PLT 225 222 245   Cardiac Enzymes: No results for input(s): CKTOTAL, CKMB, CKMBINDEX, TROPONINI in the last 168 hours. BNP (last 3 results)  Recent Labs  10/22/14 2018  BNP 315.7*    ProBNP (last 3 results) No results for input(s): PROBNP in the last 8760 hours.  CBG:  Recent Labs Lab 10/26/14 1655 10/26/14 2124 10/27/14 0745 10/27/14 1147 10/27/14 1647  GLUCAP 114* 116* 98 98 118*    Recent Results (from the past 240 hour(s))  Culture, blood (routine x 2)     Status: None   Collection Time: 10/22/14  8:18 PM  Result Value Ref Range Status   Specimen Description BLOOD RIGHT ARM  Final   Special Requests BOTTLES DRAWN AEROBIC AND ANAEROBIC 5CC  Final   Culture NO GROWTH 5 DAYS  Final   Report Status 10/27/2014 FINAL  Final  Culture, blood (routine x 2)     Status: None   Collection Time: 10/22/14  8:26 PM  Result Value Ref Range Status   Specimen Description BLOOD RIGHT HAND  Final   Special Requests BOTTLES DRAWN AEROBIC AND ANAEROBIC 5CC  Final   Culture NO GROWTH 5 DAYS  Final   Report Status 10/27/2014 FINAL  Final  Urine culture     Status: None   Collection Time: 10/22/14  8:35 PM  Result Value Ref Range Status   Specimen Description URINE, CLEAN CATCH  Final   Special Requests NONE  Final   Culture   Final    MULTIPLE SPECIES PRESENT, SUGGEST RECOLLECTION IF CLINICALLY INDICATED   Report Status 10/24/2014 FINAL  Final     Studies: No results found.  Scheduled Meds: . finasteride  5 mg Oral Daily  . folic acid  1 mg Oral Daily  . guaiFENesin  600 mg Oral BID  . lactose free nutrition   237 mL Oral TID BM  . metoprolol succinate  25 mg Oral Daily  . multivitamin with minerals  1 tablet Oral Daily  . pantoprazole  40 mg Oral Daily  . potassium chloride  10 mEq Oral Daily  . propylthiouracil  100 mg Oral BID  . [START ON 10/29/2014] rosuvastatin  10 mg Oral Q Mon  . senna-docusate  1 tablet Oral QHS  . tamsulosin  0.4 mg Oral QHS  . thiamine  100 mg Oral Daily   Continuous Infusions:    Active Problems:   CAD s/p CABG 1995   Essential hypertension   Dyslipidemia  GERD (gastroesophageal reflux disease)   Cancer of floor of mouth   AKI (acute kidney injury)   Oral cancer   Lung mass   Malnutrition of moderate degree    Time spent: 35 min    Brietta Manso MD PhD  [facility] Pager [phone]. If 7PM-7AM, please contact night-coverage at [email or web address], password [facility] 10/27/2014, 5:22 PM  LOS: 4 days

## 2014-10-27 NOTE — Progress Notes (Signed)
CSW spoke with patient's nephew Cecille Amsterdam this afternoon to discuss patient's d/c to SNF.  He states that he is patient's HCPOA and lives in [place]. There is no family in [place] to assist patient. Discussed SNF bed offers; patient and nephew have requested [facility] and a bed offer was received from [facility]- however, the facility's nursing supervisor refused to accept patient stating that they do not accept weekend admissions unless everything is cleared by admissions by Friday.  CSW discussed with nephew who stated that patient's wife is a former resident of [facility] and patient is familiar with this facility and no other SNF.  Discussed need to choose another facility as patient is medically stable for d/c. Current bed offers given to nephew; since he lives out of state- he will ask patient's neighbor and friend Sondra Barges to advise patient on which facility to choose temporarily until he can get to [facility].  Nephew states he plans to move patient to [place] as soon as possible.  Nephew cannot come to [facility] to sign admission paperwork but states that he has a fax machine and forms can be sent to him. Otherwise- patient will have to sign himself in. Nursing reports that he is alert and oriented; nephew feels that he is having some difficulty retaining information long term.  Patient/nephew will need to choose a facility for d/c tomorrow if patient remains medically stable per MD.  Nephew's fax and phone numbers left for weekend CSW coverage.  Lorie Phenix. Pauline Good, [place]

## 2014-10-28 DIAGNOSIS — E860 Dehydration: Secondary | ICD-10-CM

## 2014-10-28 LAB — BASIC METABOLIC PANEL
Anion gap: 5 (ref 5–15)
BUN: 21 mg/dL — ABNORMAL HIGH (ref 6–20)
CO2: 24 mmol/L (ref 22–32)
Calcium: 8.6 mg/dL — ABNORMAL LOW (ref 8.9–10.3)
Chloride: 109 mmol/L (ref 101–111)
Creatinine, Ser: 1.02 mg/dL (ref 0.61–1.24)
GFR calc Af Amer: >60 mL/min (ref >60)
GFR calc non Af Amer: >60 mL/min (ref >60)
GLUCOSE: 122 mg/dL — AB (ref 65–99)
Potassium: 4.2 mmol/L (ref 3.5–5.1)
Sodium: 138 mmol/L (ref 135–145)

## 2014-10-28 LAB — GLUCOSE, CAPILLARY
Glucose-Capillary: 99 mg/dL (ref 65–99)
Glucose-Capillary: 117 mg/dL — ABNORMAL HIGH (ref 65–99)

## 2014-10-28 MED ORDER — BENZONATATE 100 MG PO CAPS: 100.0000 mg | ORAL_CAPSULE | Freq: Three times a day (TID) | ORAL | Status: AC | PRN

## 2014-10-28 MED ORDER — TRAMADOL HCL 50 MG PO TABS: 50.0000 mg | ORAL_TABLET | Freq: Two times a day (BID) | ORAL | Status: AC | PRN

## 2014-10-28 MED ORDER — SENNOSIDES-DOCUSATE SODIUM 8.6-50 MG PO TABS: 1.0000 | ORAL_TABLET | Freq: Every day | ORAL | Status: AC

## 2014-10-28 MED ORDER — THIAMINE HCL 100 MG PO TABS: 100.0000 mg | ORAL_TABLET | Freq: Every day | ORAL | Status: AC

## 2014-10-28 MED ORDER — ACETAMINOPHEN 500 MG PO TABS: 500.0000 mg | ORAL_TABLET | Freq: Three times a day (TID) | ORAL | Status: AC | PRN

## 2014-10-28 MED ORDER — BOOST PLUS PO LIQD: 237.0000 mL | Freq: Three times a day (TID) | ORAL | Status: AC

## 2014-10-28 MED ORDER — GUAIFENESIN ER 600 MG PO TB12: 600.0000 mg | ORAL_TABLET | Freq: Two times a day (BID) | ORAL | Status: AC

## 2014-10-28 NOTE — Clinical Social Work Placement (Signed)
   CLINICAL SOCIAL WORK PLACEMENT  NOTE  Date:  10/28/2014  Patient Details  Name: Austin Ward MRN: [id number] Date of Birth: 1923-07-16  Clinical Social Work is seeking post-discharge placement for this patient at the [place] level of care (*CSW will initial, date and re-position this form in  chart as items are completed):  Yes   Patient/family provided with [facility] Work Department's list of facilities offering this level of care within the geographic area requested by the patient (or if unable, by the patient's family).  Yes   Patient/family informed of their freedom to choose among providers that offer the needed level of care, that participate in Medicare, Medicaid or managed care program needed by the patient, have an available bed and are willing to accept the patient.  Yes   Patient/family informed of [facility]'s ownership interest in [facility] and [facility], as well as of the fact that they are under no obligation to receive care at these facilities.  PASRR submitted to EDS on 10/25/14     PASRR number received on 10/25/14     Existing PASRR number confirmed on       FL2 transmitted to all facilities in geographic area requested by pt/family on 10/25/14     FL2 transmitted to all facilities within larger geographic area on       Patient informed that his/her managed care company has contracts with or will negotiate with certain facilities, including the following:        Yes   Patient/family informed of bed offers received.  Patient chooses bed at [facility]     Physician recommends and patient chooses bed at      Patient to be transferred to [facility] on 10/28/14.  Patient to be transferred to facility by ambulance     Patient family notified on 10/28/14 of transfer.  Name of family member notified:  Cecille Amsterdam     PHYSICIAN Please sign FL2     Additional Comment:   Per MD patient ready for DC Ritta Slot RN, patient, patient's family, and facility notified of DC. RN given number for report. DC packet on chart. Ambulance transport requested for patient for 1:30PM. CSW signing off.   _______________________________________________ Liz Beach MSW, [place], [place], [phone]

## 2014-10-28 NOTE — Discharge Summary (Signed)
Discharge Summary  Austin Ward OEU:[id number] DOB: 07/24/1923  PCP: [name], MD  Admit date: 10/22/2014 Discharge date: 10/28/2014  Time spent: >10mns  Recommendations for Outpatient Follow-up:  1. F/u with PMD with in a week, pmd to repeat bmp in one week. 2. F/u with oncology Dr. SBenay Spicein two weeks for lung mass ( biopsy + adenocarcinoma)  Discharge Diagnoses:  Active Hospital Problems   Diagnosis Date Noted  . Malnutrition of moderate degree 10/27/2014  . Lung mass   . AKI (acute kidney injury) 10/22/2014  . Oral cancer 10/22/2014  . 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 GERD (gastroesophageal reflux disease) 08/04/2013  . Dyslipidemia 04/16/2013  . Essential hypertension 04/16/2013  . CAD s/p CABG 1995 04/16/2013    Resolved Hospital Problems   Diagnosis Date Noted Date Resolved  No resolved problems to display.    Discharge Condition: stable  Diet recommendation: heart healthy/carb modified  Filed Weights   10/26/14 2126 10/27/14 0726 10/27/14 2048  Weight: 68.493 kg (151 lb) 68.7 kg (151 lb 7.3 oz) 69.1 kg (152 lb 5.4 oz)    History of present illness:  Austin Hinelyis a 81y.o. male with history of oral cancer HTN Hyperlipidemia GERD presents with cough and an abnormal CT scan. Patient was sent to ED by EMS after neighbor found that he has not been out for three days, neighbor reported that patient was laying in bed, very confused, when they entered his house, they called EMS. Patient is a very poor historian, very poor short term memory. he related to the admitting MD  that he had been having increased fatigue and has been doing a lot of sleeping. He states that he lost track of two days so he decided to come into the hospital. Patient had been having a cough. Patient stats that he has had some sputum no hemoptysis noted. He has had no fever noted. Patient states that he has no chest pain. In the ED he had a CT scan done and this shows a mass  in right lower lobe of the lung. He was dehydrated , confused, hypokalemia and  in acute renal failure, he is admitted to the hospital.  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 Now presenting with lung mass on the chest x-ray.s/p biopsy by IR on 6/22, + adenocarcinoma by pathology report. Ct head/ab/pelvis no metastasis, Appreciate oncology Dr. SBenay Spiceinput. Outpatient follow up with oncology in two weeks.  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 Does report cough, passed swallow eval, now on regular diet and thin liquid.  Hypertension- blood pressure well control, continue metoprolol, d/c cardizem.  Coronary artery disease- stable, patient has a history of CABG and [facility] MDani Gobbleas outpatient.  Acute renal failure- cr normalized with IV fluids, ivf stopped on 6/25, encourage oral intake. D/c maxzide.  Hypokalemia- replace potassium daily, Mag 2.1  Diabetes mellitus, non insulin dependent- a1c 5.6. D/c sliding scale insulin with NovoLog. Diet modification. Not a good candidate for metformin due to fluctuating renal function.  H/o hyperthyroidism: on PTU, tsh  wnl.  confusion, improving, oriented x3, Ct head no acute findings. but very poor short term recall, not oriented to situation.  Cough: did present with mild fever, currently no fever, no leukocytosis, passed swallow eval, cough and mild fever likely from cancer. mucinex and cough suppressant prn.  FTT, physical therapy eval, snf.  Code Status: Full code Family Communication: No immediate family in [place]. HPOA cell phone [phone]. Ida neiers  who is a Therapist, sports work at [employer] day [employer] cell [phone] Disposition Plan: snf   Consultants:  IR  oncology  Procedures:  Ct guided Lung mass biopsy by IR on 6//22  Antibiotics:  None    Discharge Exam: BP 132/49 mmHg  Pulse 67  Temp(Src) 98 F (36.7 C) (Oral)  Resp 18  Ht '5\' 9"'$  (1.753 m)  Wt 69.1 kg (152 lb 5.4 oz)  BMI 22.49 kg/m2  SpO2 98%   General: Less confused, oriented x3, but very poor recall, not able to retain information.  Cardiovascular: S1-S2 is regular for murmurs auscultated  Respiratory: Clear to auscultation bilaterally, no wheezing  Abdomen: Soft, nontender, no organomegaly  Musculoskeletal: No edema. Extremities noted. No cyanosis or clubbing.   Neuro: less confused, mild  tremor, ataxia. Very hard of hearing   Discharge Instructions You were cared for by a hospitalist during your hospital stay. If you have any questions about your discharge medications or the care you received while you were in the hospital after you are discharged, you can call the unit and asked to speak with the hospitalist on call if the hospitalist that took care of you is not available. Once you are discharged, your primary care physician will handle any further medical issues. Please note that NO REFILLS for any discharge medications will be authorized once you are discharged, as it is imperative that you return to your primary care physician (or establish a relationship with a primary care physician if you do not have one) for your aftercare needs so that they can reassess your need for medications and monitor your lab values.  Discharge Instructions    Diet - low sodium heart healthy    Complete by:  As directed   Low salt, low fat, carb modified     Increase activity slowly    Complete by:  As directed             Medication List    STOP taking these medications        diltiazem 180 MG 24 hr capsule  Commonly known as:  CARDIZEM CD      HYDROcodone-acetaminophen 7.5-325 MG per tablet  Commonly known as:  NORCO     triamterene-hydrochlorothiazide 37.5-25 MG per tablet  Commonly known as:  MAXZIDE-25      TAKE these medications        acetaminophen 500 MG tablet  Commonly known as:  TYLENOL  Take 1 tablet (500 mg total) by mouth every 8 (eight) hours as needed for mild pain, moderate pain or fever.     benzonatate 100 MG capsule  Commonly known as:  TESSALON  Take 1 capsule (100 mg total) by mouth 3 (three) times daily as needed for cough.     finasteride 5 MG tablet  Commonly known as:  PROSCAR  Take 5 mg by mouth daily.     guaiFENesin 600 MG 12 hr tablet  Commonly known as:  MUCINEX  Take 1 tablet (600 mg total) by mouth 2 (two) times daily.  KLOR-CON 10 10 MEQ tablet  Generic drug:  potassium chloride  TAKE 1 TABLET BY MOUTH EVERY DAY     lactose free nutrition Liqd  Take 237 mLs by mouth 3 (three) times daily between meals.     metoprolol succinate 25 MG 24 hr tablet  Commonly known as:  TOPROL-XL  TAKE 1 TABLET BY MOUTH DAILY     ORACIT 490-640 MG/5ML Soln  Generic drug:  sod citrate-citric acid  Take 15 mLs by mouth 2 (two) times daily after a meal. Take 3 tablespoons in the morning and 2 tablespoons at night     pantoprazole 40 MG tablet  Commonly known as:  PROTONIX  TAKE 1 TABLET BY MOUTH DAILY     propylthiouracil 50 MG tablet  Commonly known as:  PTU  Take 100 mg by mouth 2 (two) times daily.     rosuvastatin 20 MG tablet  Commonly known as:  CRESTOR  Take 0.5 tablets (10 mg total) by mouth once a week. Monday     senna-docusate 8.6-50 MG per tablet  Commonly known as:  Senokot-S  Take 1 tablet by mouth at bedtime.     tamsulosin 0.4 MG Caps capsule  Commonly known as:  FLOMAX  Take 0.4 mg by mouth at bedtime. At bedtime     thiamine 100 MG tablet  Take 1 tablet (100 mg total) by mouth daily.     traMADol 50 MG tablet  Commonly known as:  ULTRAM  Take 1 tablet (50 mg  total) by mouth every 12 (twelve) hours as needed for moderate pain or severe pain.     Vitamin D3 2000 UNITS Tabs  Take 2,000 Int'l Units by mouth daily.       Allergies  Allergen Reactions  . Ace Inhibitors Other (See Comments)    Intolerance. Reaction unknown.  . Lipitor [Atorvastatin] Other (See Comments)    Leg weakness       Follow-up Information    Follow up with [name], MD In 1 week.   Specialty:  Family Medicine   Contact information:   [address] [place] [address] [phone]       Follow up with Betsy Coder, MD In 2 weeks.   Specialty:  Oncology   Why:  lung mass   Contact information:   [place] [address] [phone]        The results of significant diagnostics from this hospitalization (including imaging, microbiology, ancillary and laboratory) are listed below for reference.    Significant Diagnostic Studies: Ct Abdomen Pelvis Wo Contrast  10/25/2014   CLINICAL DATA:  Weight loss, cancer workup  EXAM: CT ABDOMEN AND PELVIS WITHOUT CONTRAST  TECHNIQUE: Multidetector CT imaging of the abdomen and pelvis was performed following the standard protocol without IV contrast.  COMPARISON:  Partial comparison to CT chest dated 10/22/2014. PET-CT dated 11/17/2013. CT abdomen pelvis dated 11/08/2013.  FINDINGS: Lower chest: 5.6 cm left lower lobe lung mass, incompletely visualized, suspicious for primary bronchogenic neoplasm versus metastasis. This is better visualized on recent CT chest.  Trace bilateral pleural effusions with associated dependent atelectasis.  Hepatobiliary: Unenhanced liver is unremarkable.  Layering gallstones, without associated inflammatory changes.  Pancreas: Within normal limits.  Spleen: Within normal limits.  Adrenals/Urinary Tract: Adrenal glands are within normal limits.  Bilateral nonobstructing renal calculi, measuring up to 10 mm in the left upper pole (series 201/ image 34).   Bilateral renal cysts, measuring up to 3.5 cm  in the left upper pole (series 201/ image 37) and 2.1 cm in the right upper pole (series 201/ image 35).  No ureteral or bladder calculi.  No hydronephrosis.  High density fluid within the bladder, correlate for hematuria. Dilute excretory contrast related to prior CT/procedure is also possible. Stable herniation of the bladder via a lower ventral hernia (series 201/image 78).  Stomach/Bowel: Stomach is within normal limits.  No evidence of bowel obstruction.  Normal appendix (series 201/ image 59).  Extensive colonic diverticulosis, without evidence of diverticulitis.  Vascular/Lymphatic: Atherosclerotic calcifications of the abdominal aorta and branch vessels.  No suspicious abdominopelvic lymphadenopathy.  Reproductive: Prostatomegaly, with enlargement of the central gland which indents the base of the bladder.  Other: No abdominopelvic ascites.  3.4 x 4.8 cm subcutaneous lesion in the left gluteal region (series 201/image 81), chronic, possibly reflecting a sebaceous cyst.  Musculoskeletal: Degenerative changes of the visualized thoracolumbar spine. No focal osseous lesions.  IMPRESSION: Right lower lobe mass, incompletely visualized, suspicious for primary bronchogenic neoplasm versus metastasis, better evaluated on recent CT chest.  No findings suspicious for metastatic disease in the abdomen/pelvis.  Cholelithiasis, without associated inflammatory changes.  Nonobstructing bilateral renal calculi, measuring up to 10 mm in the left upper pole. No ureteral or bladder calculi. No hydronephrosis.  High density material in the bladder, correlate for hematuria versus contrast related to prior CT/procedure.  Additional ancillary findings as above.   Electronically Signed   By: Julian Hy M.D.   On: 10/25/2014 08:32   Dg Chest 2 View  10/22/2014   CLINICAL DATA:  Cough.  EXAM: CHEST  2 VIEW  COMPARISON:  07/21/2013  FINDINGS: New rounded mass density noted in the  right lower lung measuring 4.3 cm. Concern for lung cancer. Left lung scratch head no confluent opacity on the left. Prior CABG. Heart is normal size. No effusions. No acute bony abnormality.  IMPRESSION: 4.3 cm right to lower lobe rounded masslike area concerning for malignancy.   Electronically Signed   By: Rolm Baptise M.D.   On: 10/22/2014 19:07   Ct Head Wo Contrast  10/25/2014   CLINICAL DATA:  Acute onset of confusion.  Initial encounter.  EXAM: CT HEAD WITHOUT CONTRAST  TECHNIQUE: Contiguous axial images were obtained from the base of the skull through the vertex without intravenous contrast.  COMPARISON:  PET/CT performed 11/17/2013  FINDINGS: There is no evidence of acute infarction, mass lesion, or intra- or extra-axial hemorrhage on CT.  Prominence of the ventricles and sulci reflects moderate cortical volume loss. Cerebellar atrophy is noted. Scattered periventricular white matter change likely reflects small vessel ischemic microangiopathy.  The brainstem and fourth ventricle are within normal limits. The basal ganglia are unremarkable in appearance. The cerebral hemispheres demonstrate grossly normal gray-white differentiation. No mass effect or midline shift is seen.  There is no evidence of fracture; visualized osseous structures are unremarkable in appearance. The orbits are within normal limits. Mucosal thickening is noted at the maxillary sinuses bilaterally, and at the ethmoid air cells. The remaining paranasal sinuses and mastoid air cells are well-aerated. A 2.2 cm cystic lesion posterior to the left ear is minimally increased in size from 2015 and likely benign; no abnormal FDG uptake was noted on the prior study.  IMPRESSION: 1. No acute intracranial pathology seen on CT. 2. Moderate cortical volume loss and scattered small vessel ischemic microangiopathy. 3. 2.2 cm cystic lesion posterior to the left ear is minimally increased in size from 2015 and appears to  be benign. 4. Mucosal  thickening at the maxillary sinuses bilaterally, and at the ethmoid air cells.   Electronically Signed   By: Garald Balding M.D.   On: 10/25/2014 00:10   Ct Chest W Contrast  10/22/2014   CLINICAL DATA:  Patient with productive cough and white sputum. Lymph  EXAM: CT CHEST WITH CONTRAST  TECHNIQUE: Multidetector CT imaging of the chest was performed during intravenous contrast administration.  CONTRAST:  79m OMNIPAQUE IOHEXOL 300 MG/ML  SOLN  COMPARISON:  PET-CT 11/17/2013  FINDINGS: Mediastinum/Nodes: The thyroid gland is markedly enlarged and contains multiple low-attenuation nodules. Heart is normal in size. No pericardial effusion. Coronary arterial vascular calcifications. No enlarged axillary, mediastinal or hilar lymphadenopathy.  Lungs/Pleura: The central airways are patent. Interval development of a 5.3 x 3.2 cm right lower lobe subpleural mass (image 36; series 3) with central lucency. Minimal dependent atelectasis left lower lobe. Additionally re- demonstrated is a stable 6 mm nodule within the left lower lobe. Re- demonstrated 4 mm predominately ground-glass right lower lobe nodule (image 37; series 3). No pleural effusion or pneumothorax.  Upper abdomen: Stable subcapsular low-attenuation lesion within the liver, right hepatic lobe. Multiple gallstones within the gallbladder lumen. No surrounding inflammatory stranding. Pancreatic atrophy.  Musculoskeletal: Thoracic spine degenerative changes. No aggressive or acute appearing osseous lesions.  IMPRESSION: Interval development of a 5.3 cm subpleural right lower lobe mass with central lucency concerning for neoplastic process, potentially metastatic disease in the setting of known head and neck carcinoma.  Cholelithiasis without CT evidence for acute cholecystitis.   Electronically Signed   By: DLovey NewcomerM.D.   On: 10/22/2014 22:53   Ct Biopsy  10/24/2014   CLINICAL DATA:  81year old male with a history of head and neck carcinoma, with recent  admission and evidence of new right lung mass of the right lower lobe.  He has been referred for biopsy.  EXAM: CT-GUIDED BIOPSY right lung mass.  MEDICATIONS AND MEDICAL HISTORY: Versed 1.0 mg, Fentanyl 50 mcg.  Additional Medications: None.  ANESTHESIA/SEDATION: Moderate sedation time: 18 minutes  PROCEDURE: The procedure, risks, benefits, and alternatives were explained to the patient. Questions regarding the procedure were encouraged and answered. The patient understands and consents to the procedure.  Patient position in right decubitus position with scout CT of the chest performed.  The patient was then prepped and draped in the usual sterile fashion, the skin and subcutaneous tissues overlying the right lower lobe mass were generously infiltrated with 1% lidocaine for local anesthesia. A small stab incision was made with 11 blade scalpel, and using CT guidance, a guide needle was advanced into the mass.  Three separate 18 gauge core biopsy were retrieved.  The needle was then removed.  Final image was stored.  Patient tolerated the procedure well and remained hemodynamically stable throughout.  No complications were encountered and no significant blood loss was encountered.  FINDINGS: The images document guide needle placement within the right lower lobe mass. Post biopsy images demonstrate no pneumothorax or significant hemorrhage.  COMPLICATIONS: None  IMPRESSION: Status post right lower lobe mass biopsy, with tissue specimen sent to pathology for complete histopathologic analysis.  Signed,  JDulcy Fanny WEarleen Newport DO  [facility]  [facility]  PLAN: Patient will be observed in right decubitus position or supine position for 3 hours.  Chest x-ray acquired at 2 p.m. to observe for potential pneumothorax development.  The patient will be NPO until completion of the chest x-ray.  After  clearance, patient may receive prophylactic anti coagulation for DVT prophylaxis.    Electronically Signed   By: Corrie Mckusick D.O.   On: 10/24/2014 13:09   Dg Chest Port 1 View  10/24/2014   CLINICAL DATA:  Lung mass post biopsy  EXAM: PORTABLE CHEST - 1 VIEW  COMPARISON:  Portable exam 1511 hours compared to 10/22/2014  FINDINGS: Normal heart size post CABG.  Atherosclerotic calcification aorta.  Mediastinal contours and pulmonary vascularity normal.  Bronchitic changes again noted.  4 cm diameter RIGHT perihilar mass again identified, unchanged.  No infiltrate, pleural effusion or pneumothorax.  IMPRESSION: Bronchitic changes with stable appearance of RIGHT perihilar mass.  No pneumothorax post biopsy.   Electronically Signed   By: Lavonia Dana M.D.   On: 10/24/2014 15:18    Microbiology: Recent Results (from the past 240 hour(s))  Culture, blood (routine x 2)     Status: None   Collection Time: 10/22/14  8:18 PM  Result Value Ref Range Status   Specimen Description BLOOD RIGHT ARM  Final   Special Requests BOTTLES DRAWN AEROBIC AND ANAEROBIC 5CC  Final   Culture NO GROWTH 5 DAYS  Final   Report Status 10/27/2014 FINAL  Final  Culture, blood (routine x 2)     Status: None   Collection Time: 10/22/14  8:26 PM  Result Value Ref Range Status   Specimen Description BLOOD RIGHT HAND  Final   Special Requests BOTTLES DRAWN AEROBIC AND ANAEROBIC 5CC  Final   Culture NO GROWTH 5 DAYS  Final   Report Status 10/27/2014 FINAL  Final  Urine culture     Status: None   Collection Time: 10/22/14  8:35 PM  Result Value Ref Range Status   Specimen Description URINE, CLEAN CATCH  Final   Special Requests NONE  Final   Culture   Final    MULTIPLE SPECIES PRESENT, SUGGEST RECOLLECTION IF CLINICALLY INDICATED   Report Status 10/24/2014 FINAL  Final     Labs: Basic Metabolic Panel:  Recent Labs Lab 10/23/14 0322 10/24/14 0615 10/25/14 0509 10/26/14 0329 10/28/14 0343  NA 139 146* 145 142 138  K 3.4* 3.9 3.6 3.8 4.2  CL 105 111 109 109 109  CO2 '25 25 28 23 24  '$ GLUCOSE 134*  111* 130* 134* 122*  BUN 34* 34* 30* 21* 21*  CREATININE 1.27* 1.28* 1.14 0.93 1.02  CALCIUM 8.7* 9.0 9.2 8.8* 8.6*  MG  --   --  2.1  --   --    Liver Function Tests:  Recent Labs Lab 10/22/14 2018 10/23/14 0322 10/25/14 0509  AST '18 17 18  '$ ALT 10* 10* 12*  ALKPHOS 55 52 49  BILITOT 1.3* 1.3* 0.9  PROT 6.3* 6.0* 5.5*  ALBUMIN 3.4* 3.3* 2.9*   No results for input(s): LIPASE, AMYLASE in the last 168 hours. No results for input(s): AMMONIA in the last 168 hours. CBC:  Recent Labs Lab 10/22/14 2018 10/23/14 0322 10/25/14 0509  WBC 8.5 9.4 7.6  NEUTROABS 7.0  --   --   HGB 15.1 14.7 14.0  HCT 43.3 42.9 41.2  MCV 85.9 86.5 86.2  PLT 225 222 245   Cardiac Enzymes: No results for input(s): CKTOTAL, CKMB, CKMBINDEX, TROPONINI in the last 168 hours. BNP: BNP (last 3 results)  Recent Labs  10/22/14 2018  BNP 315.7*    ProBNP (last 3 results) No results for input(s): PROBNP in the last 8760 hours.  CBG:  Recent Labs Lab 10/27/14 0745  10/27/14 1147 10/27/14 1647 10/27/14 2047 10/28/14 0750  GLUCAP 98 98 118* 139* 99       Signed:  Daryle Amis MD, PhD  [facility] 10/28/2014, 10:33 AM

## 2014-10-28 NOTE — Progress Notes (Signed)
Report given to [facility])

## 2014-11-01 ENCOUNTER — Other Ambulatory Visit: Payer: Self-pay | Admitting: *Deleted

## 2014-11-01 DIAGNOSIS — R918 Other nonspecific abnormal finding of lung field: Secondary | ICD-10-CM

## 2014-11-07 ENCOUNTER — Telehealth: Payer: Self-pay | Admitting: Internal Medicine

## 2014-11-07 NOTE — Telephone Encounter (Signed)
S/w sabrina @ [facility] and gave np appt for 07/20 @ 1:45 w/Dr. Reginia Naas [facility] [phone].

## 2014-11-08 ENCOUNTER — Encounter (HOSPITAL_COMMUNITY)
Admission: RE | Admit: 2014-11-08 | Discharge: 2014-11-08 | Disposition: A | Discharge status: Home / Self Care | Payer: No Typology Code available for payment source | Source: Ambulatory Visit | Attending: Oncology | Admitting: Oncology

## 2014-11-08 DIAGNOSIS — C3431 Malignant neoplasm of lower lobe, right bronchus or lung: Secondary | ICD-10-CM | POA: Insufficient documentation

## 2014-11-08 LAB — GLUCOSE, CAPILLARY: GLUCOSE-CAPILLARY: 118 mg/dL — AB (ref 65–99)

## 2014-11-08 MED ORDER — FLUDEOXYGLUCOSE F - 18 (FDG) INJECTION
7.6300 | Freq: Once | INTRAVENOUS | Status: AC | PRN
  Administered 2014-11-08: 7.63 via INTRAVENOUS

## 2014-11-21 ENCOUNTER — Ambulatory Visit (HOSPITAL_BASED_OUTPATIENT_CLINIC_OR_DEPARTMENT_OTHER): Payer: Medicare Other | Admitting: Internal Medicine

## 2014-11-21 ENCOUNTER — Encounter: Payer: Self-pay | Admitting: *Deleted

## 2014-11-21 ENCOUNTER — Telehealth: Payer: Self-pay | Admitting: Internal Medicine

## 2014-11-21 ENCOUNTER — Other Ambulatory Visit (HOSPITAL_BASED_OUTPATIENT_CLINIC_OR_DEPARTMENT_OTHER): Payer: Medicare Other

## 2014-11-21 ENCOUNTER — Encounter: Payer: Self-pay | Admitting: Internal Medicine

## 2014-11-21 ENCOUNTER — Ambulatory Visit: Payer: Medicare Other

## 2014-11-21 VITALS — BP 127/56 | HR 82 | Temp 98.1°F | Resp 18 | Ht 69.0 in | Wt 134.7 lb

## 2014-11-21 DIAGNOSIS — R05 Cough: Secondary | ICD-10-CM | POA: Diagnosis not present

## 2014-11-21 DIAGNOSIS — I6523 Occlusion and stenosis of bilateral carotid arteries: Secondary | ICD-10-CM

## 2014-11-21 DIAGNOSIS — C3431 Malignant neoplasm of lower lobe, right bronchus or lung: Secondary | ICD-10-CM

## 2014-11-21 DIAGNOSIS — C3492 Malignant neoplasm of unspecified part of left bronchus or lung: Secondary | ICD-10-CM

## 2014-11-21 DIAGNOSIS — J90 Pleural effusion, not elsewhere classified: Secondary | ICD-10-CM

## 2014-11-21 DIAGNOSIS — I10 Essential (primary) hypertension: Secondary | ICD-10-CM | POA: Diagnosis not present

## 2014-11-21 DIAGNOSIS — I2510 Atherosclerotic heart disease of native coronary artery without angina pectoris: Secondary | ICD-10-CM

## 2014-11-21 DIAGNOSIS — Z87891 Personal history of nicotine dependence: Secondary | ICD-10-CM

## 2014-11-21 DIAGNOSIS — R0602 Shortness of breath: Secondary | ICD-10-CM | POA: Diagnosis not present

## 2014-11-21 DIAGNOSIS — R918 Other nonspecific abnormal finding of lung field: Secondary | ICD-10-CM

## 2014-11-21 DIAGNOSIS — Z8589 Personal history of malignant neoplasm of other organs and systems: Secondary | ICD-10-CM

## 2014-11-21 LAB — CBC WITH DIFFERENTIAL/PLATELET
BASO%: 0.2 % (ref 0.0–2.0)
Basophils Absolute: 0 10*3/uL (ref 0.0–0.1)
EOS%: 1.2 % (ref 0.0–7.0)
Eosinophils Absolute: 0.1 10*3/uL (ref 0.0–0.5)
HCT: 43.5 % (ref 38.4–49.9)
HEMOGLOBIN: 14.5 g/dL (ref 13.0–17.1)
LYMPH#: 0.9 10*3/uL (ref 0.9–3.3)
LYMPH%: 11 % — AB (ref 14.0–49.0)
MCH: 29.2 pg (ref 27.2–33.4)
MCHC: 33.3 g/dL (ref 32.0–36.0)
MCV: 87.7 fL (ref 79.3–98.0)
MONO#: 0.6 10*3/uL (ref 0.1–0.9)
MONO%: 7 % (ref 0.0–14.0)
NEUT%: 80.6 % — AB (ref 39.0–75.0)
NEUTROS ABS: 6.9 10*3/uL — AB (ref 1.5–6.5)
Platelets: 264 10*3/uL (ref 140–400)
RBC: 4.96 10*6/uL (ref 4.20–5.82)
RDW: 14.4 % (ref 11.0–14.6)
WBC: 8.5 10*3/uL (ref 4.0–10.3)

## 2014-11-21 LAB — COMPREHENSIVE METABOLIC PANEL (CC13)
ALBUMIN: 3.4 g/dL — AB (ref 3.5–5.0)
ALT: 9 U/L (ref 0–55)
AST: 12 U/L (ref 5–34)
Alkaline Phosphatase: 62 U/L (ref 40–150)
Anion Gap: 8 mEq/L (ref 3–11)
BUN: 19.9 mg/dL (ref 7.0–26.0)
CALCIUM: 9.9 mg/dL (ref 8.4–10.4)
CO2: 30 meq/L — AB (ref 22–29)
Chloride: 106 mEq/L (ref 98–109)
Creatinine: 1.1 mg/dL (ref 0.7–1.3)
EGFR: 59 mL/min/{1.73_m2} — AB (ref >90)
GLUCOSE: 132 mg/dL (ref 70–140)
Potassium: 4 mEq/L (ref 3.5–5.1)
Sodium: 144 mEq/L (ref 136–145)
TOTAL PROTEIN: 6.5 g/dL (ref 6.4–8.3)
Total Bilirubin: 0.56 mg/dL (ref 0.20–1.20)

## 2014-11-21 NOTE — Progress Notes (Signed)
[facility] Telephone:([phone]) [phone]   Fax:([phone]) [phone]  CONSULT NOTE  REFERRING PHYSICIAN: Dr. Cyril Mourning  REASON FOR CONSULTATION:  81 years old white male recently diagnosed with lung cancer.  HPI Austin Ward is a 81 y.o. male was past medical history significant for hypertension, dyslipidemia, coronary artery disease status post CABG 20 years ago, bilateral carotid stenosis, GERD, AV stenosis and remote history of his smoking but quit in 1972. The patient also has a history of head and neck cancer presented with neoplasm, squamous cell carcinoma, of the floor of the mouth status post resection and lymph node dissection by Dr. Pollyann Kennedy on 04/19/2014. A PET scan that was performed on 11/17/2013 showed questionable right lower lobe pulmonary nodule without associated metabolic activity suspicious for infectious or inflammatory process. He did not receive any chemotherapy or radiation. The patient has been complaining of increasing fatigue and weakness as well as shortness breath and weight loss for several months. He was found by a neighbor laying down in his home for unknown period. He was transferred by EMS to [facility] emergency department for further evaluation. Chest x-ray on 10/22/2014 showed 4.3 cm right lower lobe round masslike area concerning for malignancy. CT scan of the chest with contrast on 10/22/2014 showed interval development of 5.3 x 3.2 cm a right lower lobe subpleural mass with central lucency concerning for neoplastic process potentially metastatic disease in the setting of known head and neck carcinoma. On 10/24/2014 the patient underwent CT-guided core biopsy of the right lower lobe lung mass and the final pathology (Accession: [id number]) was consistent with adenocarcinoma. CT scan of the head on 10/25/2014 showed no evidence for disease progression to the brain. The patient also has CT scan of the abdomen and pelvis that showed no evidence for  metastatic disease in the abdomen or pelvis. He was seen during his hospitalization by my partner Dr. Truett Perna who ordered a PET scan and this was performed on 11/08/2014 and it showed the right lower lobe hypermetabolic lung mass measuring 5.9 x 3.7 cm with SUV of 7.5. There was no thoracic nodal hypermetabolism and no evidence for metastatic disease. There was new small right pleural effusion and decrease in the floor of mouth hypermetabolism compared to the previous PET scan. No residual or recurrent nodal metastasis from the head and neck primary. The patient was referred to me today for further evaluation and recommendation regarding treatment of his condition. When seen today he is feeling fine with no specific complaints except for mild shortness of breath and cough. He also has weight loss of around 7 pounds in the last year or so. He has occasional headache. He denied having any significant chest pain or hemoptysis. He denied having any fever or chills. He has no nausea or vomiting. His father and mother died from heart disease. The patient is single and has no children. He has a nephew in [place] with the health power of attorney. He was accompanied today by 2 neighbor friends, Babette Relic and Vikki Ports. He used to work as an Air cabin crew. The patient has remote history of smoking but quit in 1972. He also has a history of alcohol abuse but quit in 1972. He has no history of drug abuse.  HPI  Past Medical History  Diagnosis Date  . CAD (coronary artery disease)   . S/P CABG x 4 02/27/1994    LIMA to LAD,SVG to OM,SVG to 2nd OM,SVG to RCA-Dr. Laneta Simmers  . Heart murmur   .  Carotid arterial disease     07/04/12 doppler:right ICA 50-69%,left bulb & ICA 0-49%  . Systemic hypertension   . Dyslipidemia   . Bilateral carotid artery stenosis 04/16/2013  . Aortic valve stenosis 04/16/2013    1.5 cm by echo March 2014  . Essential hypertension 04/16/2013    Did not tolerate higher doses of statins.  Satisfactory control on once weekly Crestor   . Occasional tremors     mostly right hand  . Enlarged prostate   . GERD (gastroesophageal reflux disease)   . Cancer     oral cancer  . History of shingles 2005  . Hard of hearing   . Hyperthyroidism   . CHF (congestive heart failure)     Past Surgical History  Procedure Laterality Date  . Coronary artery bypass graft  02/27/1994    LIMA to LAD,SVG to OM,SVG to 2nd OM,SVG to RCA  . Intraocular lens insertion  2000  . Cardiac catheterization  02/25/1994  . US echocardiography  07/04/2012    mild to mod AOV stenosis,mitral annular ca+,LA mildly to mod. dilated  . Nm myoview ltd  04/01/2010    no ischemia, EF 60%  . Eye surgery Bilateral     cataract surgery  . Tonsillectomy    . Colonoscopy    . Mouth biopsy  12/13/2013    DR ROSEN  . Floor of mouth biopsy N/A 12/13/2013    Procedure: EXCISION FLOOR OF MOUTH TUMOR/SUBMANDIBULAR DUCT REROUTING/EXCISIONAL BIOPSY OF LYMPHNODE;  Surgeon: Izora Gala, MD;  Location: [facility];  Service: ENT;  Laterality: N/A;  . Radical neck dissection Left 04/19/2014    Procedure: LEFT NECK DISSECTION;  Surgeon: Izora Gala, MD;  Location: [facility] OR;  Service: ENT;  Laterality: Left;    Family History  Problem Relation Age of Onset  . CAD Mother   . Hypertension Mother     Social History History  Substance Use Topics  . Smoking status: Former Smoker    Quit date: 05/04/1971  . Smokeless tobacco: Never Used  . Alcohol Use: No    Allergies  Allergen Reactions  . Ace Inhibitors Other (See Comments)    Intolerance. Reaction unknown.  . Lipitor [Atorvastatin] Other (See Comments)    Leg weakness    Current Outpatient Prescriptions  Medication Sig Dispense Refill  . acetaminophen (TYLENOL) 500 MG tablet Take 1 tablet (500 mg total) by mouth every 8 (eight) hours as needed for mild pain, moderate pain or fever. 30 tablet 0  . benzonatate (TESSALON) 100 MG capsule Take 1 capsule (100 mg total) by  mouth 3 (three) times daily as needed for cough. 20 capsule 0  . Cholecalciferol (VITAMIN D3) 2000 UNITS TABS Take 2,000 Int'l Units by mouth daily.    . finasteride (PROSCAR) 5 MG tablet Take 5 mg by mouth daily.    Marland Kitchen KLOR-CON 10 10 MEQ tablet TAKE 1 TABLET BY MOUTH EVERY DAY 30 tablet 10  . lactose free nutrition (BOOST PLUS) LIQD Take 237 mLs by mouth 3 (three) times daily between meals. 30 Can 0  . metoprolol succinate (TOPROL-XL) 25 MG 24 hr tablet TAKE 1 TABLET BY MOUTH DAILY 30 tablet 10  . pantoprazole (PROTONIX) 40 MG tablet TAKE 1 TABLET BY MOUTH DAILY 30 tablet 6  . propylthiouracil (PTU) 50 MG tablet Take 100 mg by mouth 2 (two) times daily.     . rosuvastatin (CRESTOR) 20 MG tablet Take 0.5 tablets (10 mg total) by mouth once a week. Monday 7  tablet 0  . senna-docusate (SENOKOT-S) 8.6-50 MG per tablet Take 1 tablet by mouth at bedtime. 15 tablet 0  . tamsulosin (FLOMAX) 0.4 MG CAPS Take 0.4 mg by mouth at bedtime. At bedtime    . guaiFENesin (MUCINEX) 600 MG 12 hr tablet Take 1 tablet (600 mg total) by mouth 2 (two) times daily. 30 tablet 0  . sod citrate-citric acid (ORACIT) 490-640 MG/5ML SOLN Take 15 mLs by mouth 2 (two) times daily after a meal. Take 3 tablespoons in the morning and 2 tablespoons at night    . thiamine 100 MG tablet Take 1 tablet (100 mg total) by mouth daily. (Patient not taking: Reported on 11/21/2014) 30 tablet 0  . traMADol (ULTRAM) 50 MG tablet Take 1 tablet (50 mg total) by mouth every 12 (twelve) hours as needed for moderate pain or severe pain. (Patient not taking: Reported on 11/21/2014) 15 tablet 0   No current facility-administered medications for this visit.    Review of Systems  Constitutional: positive for anorexia, fatigue and weight loss Eyes: negative Ears, nose, mouth, throat, and face: negative Respiratory: positive for cough and dyspnea on exertion Cardiovascular: negative Gastrointestinal:  negative Genitourinary:negative Integument/breast: negative Hematologic/lymphatic: negative Musculoskeletal:positive for muscle weakness Neurological: negative Behavioral/Psych: negative Endocrine: negative Allergic/Immunologic: negative  Physical Exam  IWP:YKDXI, healthy, no distress and malnourished SKIN: skin color, texture, turgor are normal HEAD: Normocephalic, No masses, lesions, tenderness or abnormalities EYES: normal, PERRLA, Conjunctiva are pink and non-injected EARS: External ears normal, Canals clear OROPHARYNX:no exudate, no erythema and lips, buccal mucosa, and tongue normal  NECK: supple, no adenopathy, no JVD LYMPH:  no palpable lymphadenopathy, no hepatosplenomegaly LUNGS: clear to auscultation , and palpation HEART: regular rate & rhythm, no murmurs and no gallops ABDOMEN:abdomen soft, non-tender, normal bowel sounds and no masses or organomegaly BACK: Back symmetric, no curvature., No CVA tenderness EXTREMITIES:no joint deformities, effusion, or inflammation, no edema, no skin discoloration  NEURO: alert & oriented x 3 with fluent speech, no focal motor/sensory deficits  PERFORMANCE STATUS: ECOG 2  LABORATORY DATA: Lab Results  Component Value Date   WBC 8.5 11/21/2014   HGB 14.5 11/21/2014   HCT 43.5 11/21/2014   MCV 87.7 11/21/2014   PLT 264 11/21/2014      Chemistry      Component Value Date/Time   NA 144 11/21/2014 1355   NA 138 10/28/2014 0343   K 4.0 11/21/2014 1355   K 4.2 10/28/2014 0343   CL 109 10/28/2014 0343   CO2 30* 11/21/2014 1355   CO2 24 10/28/2014 0343   BUN 19.9 11/21/2014 1355   BUN 21* 10/28/2014 0343   CREATININE 1.1 11/21/2014 1355   CREATININE 1.02 10/28/2014 0343   CREATININE 1.20 10/09/2014 0939      Component Value Date/Time   CALCIUM 9.9 11/21/2014 1355   CALCIUM 8.6* 10/28/2014 0343   ALKPHOS 62 11/21/2014 1355   ALKPHOS 49 10/25/2014 0509   AST 12 11/21/2014 1355   AST 18 10/25/2014 0509   ALT 9 11/21/2014  1355   ALT 12* 10/25/2014 0509   BILITOT 0.56 11/21/2014 1355   BILITOT 0.9 10/25/2014 0509       RADIOGRAPHIC STUDIES: Ct Abdomen Pelvis Wo Contrast  10/25/2014   CLINICAL DATA:  Weight loss, cancer workup  EXAM: CT ABDOMEN AND PELVIS WITHOUT CONTRAST  TECHNIQUE: Multidetector CT imaging of the abdomen and pelvis was performed following the standard protocol without IV contrast.  COMPARISON:  Partial comparison to CT chest dated 10/22/2014. PET-CT  dated 11/17/2013. CT abdomen pelvis dated 11/08/2013.  FINDINGS: Lower chest: 5.6 cm left lower lobe lung mass, incompletely visualized, suspicious for primary bronchogenic neoplasm versus metastasis. This is better visualized on recent CT chest.  Trace bilateral pleural effusions with associated dependent atelectasis.  Hepatobiliary: Unenhanced liver is unremarkable.  Layering gallstones, without associated inflammatory changes.  Pancreas: Within normal limits.  Spleen: Within normal limits.  Adrenals/Urinary Tract: Adrenal glands are within normal limits.  Bilateral nonobstructing renal calculi, measuring up to 10 mm in the left upper pole (series 201/ image 34).  Bilateral renal cysts, measuring up to 3.5 cm in the left upper pole (series 201/ image 37) and 2.1 cm in the right upper pole (series 201/ image 35).  No ureteral or bladder calculi.  No hydronephrosis.  High density fluid within the bladder, correlate for hematuria. Dilute excretory contrast related to prior CT/procedure is also possible. Stable herniation of the bladder via a lower ventral hernia (series 201/image 78).  Stomach/Bowel: Stomach is within normal limits.  No evidence of bowel obstruction.  Normal appendix (series 201/ image 59).  Extensive colonic diverticulosis, without evidence of diverticulitis.  Vascular/Lymphatic: Atherosclerotic calcifications of the abdominal aorta and branch vessels.  No suspicious abdominopelvic lymphadenopathy.  Reproductive: Prostatomegaly, with enlargement  of the central gland which indents the base of the bladder.  Other: No abdominopelvic ascites.  3.4 x 4.8 cm subcutaneous lesion in the left gluteal region (series 201/image 81), chronic, possibly reflecting a sebaceous cyst.  Musculoskeletal: Degenerative changes of the visualized thoracolumbar spine. No focal osseous lesions.  IMPRESSION: Right lower lobe mass, incompletely visualized, suspicious for primary bronchogenic neoplasm versus metastasis, better evaluated on recent CT chest.  No findings suspicious for metastatic disease in the abdomen/pelvis.  Cholelithiasis, without associated inflammatory changes.  Nonobstructing bilateral renal calculi, measuring up to 10 mm in the left upper pole. No ureteral or bladder calculi. No hydronephrosis.  High density material in the bladder, correlate for hematuria versus contrast related to prior CT/procedure.  Additional ancillary findings as above.   Electronically Signed   By: Julian Hy M.D.   On: 10/25/2014 08:32   Dg Chest 2 View  10/22/2014   CLINICAL DATA:  Cough.  EXAM: CHEST  2 VIEW  COMPARISON:  07/21/2013  FINDINGS: New rounded mass density noted in the right lower lung measuring 4.3 cm. Concern for lung cancer. Left lung scratch head no confluent opacity on the left. Prior CABG. Heart is normal size. No effusions. No acute bony abnormality.  IMPRESSION: 4.3 cm right to lower lobe rounded masslike area concerning for malignancy.   Electronically Signed   By: Rolm Baptise M.D.   On: 10/22/2014 19:07   Ct Head Wo Contrast  10/25/2014   CLINICAL DATA:  Acute onset of confusion.  Initial encounter.  EXAM: CT HEAD WITHOUT CONTRAST  TECHNIQUE: Contiguous axial images were obtained from the base of the skull through the vertex without intravenous contrast.  COMPARISON:  PET/CT performed 11/17/2013  FINDINGS: There is no evidence of acute infarction, mass lesion, or intra- or extra-axial hemorrhage on CT.  Prominence of the ventricles and sulci reflects  moderate cortical volume loss. Cerebellar atrophy is noted. Scattered periventricular white matter change likely reflects small vessel ischemic microangiopathy.  The brainstem and fourth ventricle are within normal limits. The basal ganglia are unremarkable in appearance. The cerebral hemispheres demonstrate grossly normal gray-white differentiation. No mass effect or midline shift is seen.  There is no evidence of fracture; visualized osseous structures are  unremarkable in appearance. The orbits are within normal limits. Mucosal thickening is noted at the maxillary sinuses bilaterally, and at the ethmoid air cells. The remaining paranasal sinuses and mastoid air cells are well-aerated. A 2.2 cm cystic lesion posterior to the left ear is minimally increased in size from 2015 and likely benign; no abnormal FDG uptake was noted on the prior study.  IMPRESSION: 1. No acute intracranial pathology seen on CT. 2. Moderate cortical volume loss and scattered small vessel ischemic microangiopathy. 3. 2.2 cm cystic lesion posterior to the left ear is minimally increased in size from 2015 and appears to be benign. 4. Mucosal thickening at the maxillary sinuses bilaterally, and at the ethmoid air cells.   Electronically Signed   By: Garald Balding M.D.   On: 10/25/2014 00:10   Ct Chest W Contrast  10/22/2014   CLINICAL DATA:  Patient with productive cough and white sputum. Lymph  EXAM: CT CHEST WITH CONTRAST  TECHNIQUE: Multidetector CT imaging of the chest was performed during intravenous contrast administration.  CONTRAST:  49mL OMNIPAQUE IOHEXOL 300 MG/ML  SOLN  COMPARISON:  PET-CT 11/17/2013  FINDINGS: Mediastinum/Nodes: The thyroid gland is markedly enlarged and contains multiple low-attenuation nodules. Heart is normal in size. No pericardial effusion. Coronary arterial vascular calcifications. No enlarged axillary, mediastinal or hilar lymphadenopathy.  Lungs/Pleura: The central airways are patent. Interval  development of a 5.3 x 3.2 cm right lower lobe subpleural mass (image 36; series 3) with central lucency. Minimal dependent atelectasis left lower lobe. Additionally re- demonstrated is a stable 6 mm nodule within the left lower lobe. Re- demonstrated 4 mm predominately ground-glass right lower lobe nodule (image 37; series 3). No pleural effusion or pneumothorax.  Upper abdomen: Stable subcapsular low-attenuation lesion within the liver, right hepatic lobe. Multiple gallstones within the gallbladder lumen. No surrounding inflammatory stranding. Pancreatic atrophy.  Musculoskeletal: Thoracic spine degenerative changes. No aggressive or acute appearing osseous lesions.  IMPRESSION: Interval development of a 5.3 cm subpleural right lower lobe mass with central lucency concerning for neoplastic process, potentially metastatic disease in the setting of known head and neck carcinoma.  Cholelithiasis without CT evidence for acute cholecystitis.   Electronically Signed   By: Lovey Newcomer M.D.   On: 10/22/2014 22:53   Nm Pet Image Initial (pi) Skull Base To Thigh  11/08/2014   CLINICAL DATA:  Subsequent treatment strategy for right lower lobe lung mass. Staging. History of head neck cancer.  EXAM: NUCLEAR MEDICINE PET SKULL BASE TO THIGH  TECHNIQUE: 7.6 mCi F-18 FDG was injected intravenously. Full-ring PET imaging was performed from the skull base to thigh after the radiotracer. CT data was obtained and used for attenuation correction and anatomic localization.  FASTING BLOOD GLUCOSE:  Value: 152 mg/dl  COMPARISON:  10/22/2014 chest CT. 10/24/2014 abdominal pelvic CT. PET of 11/17/2013.  FINDINGS: NECK  Hypermetabolism within the floor of mouth which could be physiologic. This measures a S.U.V. max of 4.7 today. Decreased since the prior exam, where it measured a S.U.V. max of 7.5.  The previously described right submental node is no longer identified. No hypermetabolic cervical nodes.  CHEST  Right lower lobe lung mass  which measures 5.9 x 3.7 cm and a S.U.V. max of 7.5 on image 35. This is enlarged from 5.2 x 3.0 cm on the prior exam. Decreased cavitation within.  No thoracic nodal hypermetabolism.  ABDOMEN/PELVIS  No areas of abnormal hypermetabolism.  SKELETON  No abnormal marrow activity. Note is made of muscular activity  throughout, mild.  CT IMAGES PERFORMED FOR ATTENUATION CORRECTION  Cerebral atrophy which is age expected. Minimal fluid the left maxillary sinus. Carotid atherosclerosis. Multiple small submental nodes which are primarily unchanged in size.  Marked thyroid enlargement is symmetric and grossly similar. Chest findings deferred to recent diagnostic CT. Trace right pleural fluid is new. Prior median sternotomy. Left lower lobe pulmonary nodule is unchanged and below the resolution of PET. Bilateral renal collecting system stones. Bilateral renal cysts. Normal adrenal glands. Cholelithiasis. Moderate prostatomegaly. Urinary bladder extending minimally into an anterior pelvic wall hernia on image 191. Chronic. Presumed sebaceous cyst superficial the left gluteal musculature.  IMPRESSION: 1. Right lower lobe hypermetabolic lung mass, consistent with primary bronchogenic carcinoma. This is enlarged since 10/24/2014, with a new small right pleural effusion. 2. No thoracic nodal or extra thoracic metastasis. 3. Decrease in floor of mouth hypermetabolism which could represent response to therapy or be physiologic/artifactual. No residual or recurrent nodal metastasis from head neck primary. 4. Incidental findings, as detailed above.   Electronically Signed   By: Abigail Miyamoto M.D.   On: 11/08/2014 13:06   Ct Biopsy  10/24/2014   CLINICAL DATA:  81 year old male with a history of head and neck carcinoma, with recent admission and evidence of new right lung mass of the right lower lobe.  He has been referred for biopsy.  EXAM: CT-GUIDED BIOPSY right lung mass.  MEDICATIONS AND MEDICAL HISTORY: Versed 1.0 mg, Fentanyl  50 mcg.  Additional Medications: None.  ANESTHESIA/SEDATION: Moderate sedation time: 18 minutes  PROCEDURE: The procedure, risks, benefits, and alternatives were explained to the patient. Questions regarding the procedure were encouraged and answered. The patient understands and consents to the procedure.  Patient position in right decubitus position with scout CT of the chest performed.  The patient was then prepped and draped in the usual sterile fashion, the skin and subcutaneous tissues overlying the right lower lobe mass were generously infiltrated with 1% lidocaine for local anesthesia. A small stab incision was made with 11 blade scalpel, and using CT guidance, a guide needle was advanced into the mass.  Three separate 18 gauge core biopsy were retrieved.  The needle was then removed.  Final image was stored.  Patient tolerated the procedure well and remained hemodynamically stable throughout.  No complications were encountered and no significant blood loss was encountered.  FINDINGS: The images document guide needle placement within the right lower lobe mass. Post biopsy images demonstrate no pneumothorax or significant hemorrhage.  COMPLICATIONS: None  IMPRESSION: Status post right lower lobe mass biopsy, with tissue specimen sent to pathology for complete histopathologic analysis.  Signed,  Dulcy Fanny. Earleen Newport, DO  [facility]  [facility]  PLAN: Patient will be observed in right decubitus position or supine position for 3 hours.  Chest x-ray acquired at 2 p.m. to observe for potential pneumothorax development.  The patient will be NPO until completion of the chest x-ray.  After clearance, patient may receive prophylactic anti coagulation for DVT prophylaxis.   Electronically Signed   By: Corrie Mckusick D.O.   On: 10/24/2014 13:09   Dg Chest Port 1 View  10/24/2014   CLINICAL DATA:  Lung mass post biopsy  EXAM: PORTABLE CHEST - 1 VIEW  COMPARISON:  Portable exam  1511 hours compared to 10/22/2014  FINDINGS: Normal heart size post CABG.  Atherosclerotic calcification aorta.  Mediastinal contours and pulmonary vascularity normal.  Bronchitic changes again noted.  4 cm diameter RIGHT perihilar mass  again identified, unchanged.  No infiltrate, pleural effusion or pneumothorax.  IMPRESSION: Bronchitic changes with stable appearance of RIGHT perihilar mass.  No pneumothorax post biopsy.   Electronically Signed   By: Lavonia Dana M.D.   On: 10/24/2014 15:18    ASSESSMENT: This is a very pleasant 81 years old white male recently diagnosed with at least stage IIA (T2b, N0, M0) non-small cell lung cancer, adenocarcinoma diagnosed in June 2016. The patient has a small right pleural effusion and if this is consistent with malignant effusion his final staging will be stage IV that this is less likely.   PLAN: I had a lengthy discussion with the patient and his friends today about his current disease status and treatment options. I explained to the patient that the standard of care for treatment of stage IIA non-small cell lung cancer would be surgical resection but I don't think the patient would be a good surgical candidate for resection because of his age and comorbidities. I discussed with the patient and several other options for treatment of his condition including curative radiotherapy. He agreed for the referral to radiation oncology to discuss this option. I also discussed with the patient sending his tissue to be tested for EGFR mutation as well as ALK gene translocation. If he has a positive mutation he may be considered for treatment with targeted therapy. I'm not sure if the patient would be a good candidate for systemic chemotherapy for his condition mainly because of his age and comorbidities. I will see him back for follow-up visit in 2 weeks for reevaluation and discussion of his treatment options based on the molecular studies. He was advised to call  immediately if he has any concerning symptoms in the interval.  The patient voices understanding of current disease status and treatment options and is in agreement with the current care plan.  All questions were answered. The patient knows to call the clinic with any problems, questions or concerns. We can certainly see the patient much sooner if necessary.  Thank you so much for allowing me to participate in the care of Austin Ward. I will continue to follow up the patient with you and assist in his care.  I spent 40 minutes counseling the patient face to face. The total time spent in the appointment was 60 minutes.  Disclaimer: This note was dictated with voice recognition software. Similar sounding words can inadvertently be transcribed and may not be corrected upon review.   Perry Brucato K. November 21, 2014, 3:24 PM

## 2014-11-21 NOTE — Progress Notes (Signed)
Checked in new pt with no financial concerns.  Pt has 2 insurances so financial assistance may not be needed but he has my card for any billing questions or concerns.

## 2014-11-21 NOTE — Progress Notes (Signed)
Oncology Nurse Navigator Documentation  Oncology Nurse Navigator Flowsheets 11/21/2014  Navigator Encounter Type Clinic/MDC  Patient Visit Type Initial/Patient was seen today by Dr. Julien Nordmann.  Diagnosis and treatment plan was discussed with him and friends of the family.  Patient is not sure about next steps but is willing to see Rad Onc to discuss further.  Patient is willing to have follow up with Dr. Julien Nordmann in 2 weeks to have more conversation about treatment plans.    Treatment Phase Abnormal Scans  Barriers/Navigation Needs Education  Education Coping with Diagnosis/ Prognosis  Interventions Education Method  Time Spent with Patient 30

## 2014-11-21 NOTE — Telephone Encounter (Signed)
Pt confirmed labs/ov per 07/20 POF, gave pt AVS and Calendar... KJ

## 2014-11-22 ENCOUNTER — Other Ambulatory Visit (HOSPITAL_COMMUNITY)
Admission: RE | Admit: 2014-11-22 | Discharge: 2014-11-22 | Disposition: A | Discharge status: Home / Self Care | Payer: No Typology Code available for payment source | Source: Ambulatory Visit | Attending: Internal Medicine | Admitting: Internal Medicine

## 2014-11-22 DIAGNOSIS — C3431 Malignant neoplasm of lower lobe, right bronchus or lung: Secondary | ICD-10-CM | POA: Insufficient documentation

## 2014-11-22 DIAGNOSIS — Z87891 Personal history of nicotine dependence: Secondary | ICD-10-CM | POA: Insufficient documentation

## 2014-11-22 DIAGNOSIS — C069 Malignant neoplasm of mouth, unspecified: Secondary | ICD-10-CM | POA: Diagnosis not present

## 2014-11-26 ENCOUNTER — Telehealth: Payer: Self-pay | Admitting: Internal Medicine

## 2014-11-26 NOTE — Progress Notes (Signed)
Radiation Oncology         ([phone]) [phone] ________________________________  Initial Outpatient Consultation  Name: Austin Ward MRN: [id number]  Date: 11/27/2014  DOB: 1923-08-14  QI:[id number],DGLOV, MD  Curt Bears, MD   REFERRING PHYSICIAN: Curt Bears, MD  DIAGNOSIS: 81 yo man with stage T2b N0 M0 adenocarcinoma of the right lower lung - Stage II    ICD-9-CM ICD-10-CM   1. Primary cancer of right lower lobe of lung 162.5 C34.31 Ambulatory referral to Social Work    HISTORY OF PRESENT ILLNESS::Austin Ward is a 81 y.o. male diagnosed with head and neck cancer in 2015. He underwent a floor the mouth resection and right submandibular lymph node biopsy on 12/13/2013. A right submandibular lymph node biopsy on 04/19/2014 was benign.  He was admitted 10/22/2014 after being found at home by his neighbor not leaving the house for the previous 2-3 days. He is not aware of the circumstances of these days other than staying in bed. He reports malaise and anorexia. No other complaint. He was noted to report a productive cough in the emergency room.  A chest x-ray 10/22/2014 revealed a 4.3 cm right lower lobe mass.      A CT confirmed a 5.3 cm subpleural right lower lobe mass with central lucency, new compared to a PET scan from 11/17/2013.    He was taken to a CT-guided biopsy of the right lower lobe mass on 10/24/2014. The pathology revealed adenocarcinoma.    PET-CT on 11/08/14 confirmed the lesion to be hypermetabolic with no adenopathy and no distant metastases.    PREVIOUS RADIATION THERAPY: No  PAST MEDICAL HISTORY:  has a past medical history of CAD (coronary artery disease); S/P CABG x 4 (02/27/1994); Heart murmur; Carotid arterial disease; Systemic hypertension; Dyslipidemia; Bilateral carotid artery stenosis (04/16/2013); Aortic valve stenosis (04/16/2013); Essential hypertension (04/16/2013); Occasional tremors; Enlarged prostate; GERD (gastroesophageal  reflux disease); Cancer; History of shingles (2005); Hard of hearing; Hyperthyroidism; and CHF (congestive heart failure).    PAST SURGICAL HISTORY: Past Surgical History  Procedure Laterality Date  . Coronary artery bypass graft  02/27/1994    LIMA to LAD,SVG to OM,SVG to 2nd OM,SVG to RCA  . Intraocular lens insertion  2000  . Cardiac catheterization  02/25/1994  . US echocardiography  07/04/2012    mild to mod AOV stenosis,mitral annular ca+,LA mildly to mod. dilated  . Nm myoview ltd  04/01/2010    no ischemia, EF 60%  . Eye surgery Bilateral     cataract surgery  . Tonsillectomy    . Colonoscopy    . Mouth biopsy  12/13/2013    DR ROSEN  . Floor of mouth biopsy N/A 12/13/2013    Procedure: EXCISION FLOOR OF MOUTH TUMOR/SUBMANDIBULAR DUCT REROUTING/EXCISIONAL BIOPSY OF LYMPHNODE;  Surgeon: Izora Gala, MD;  Location: [facility];  Service: ENT;  Laterality: N/A;  . Radical neck dissection Left 04/19/2014    Procedure: LEFT NECK DISSECTION;  Surgeon: Izora Gala, MD;  Location: [facility];  Service: ENT;  Laterality: Left;    FAMILY HISTORY: family history includes CAD in his mother; Hypertension in his mother.  SOCIAL HISTORY:  History   Social History  . Marital Status: Widowed    Spouse Name: N/A  . Number of Children: N/A  . Years of Education: N/A   Occupational History  . Not on file.   Social History Main Topics  . Smoking status: Former Smoker    Quit date: 05/04/1971  . Smokeless tobacco: Never Used  .  Alcohol Use: No  . Drug Use: No  . Sexual Activity: Not on file   Other Topics Concern  . Not on file   Social History Narrative    ALLERGIES: Ace inhibitors and Lipitor  MEDICATIONS:  Current Outpatient Prescriptions  Medication Sig Dispense Refill  . acetaminophen (TYLENOL) 500 MG tablet Take 1 tablet (500 mg total) by mouth every 8 (eight) hours as needed for mild pain, moderate pain or fever. 30 tablet 0  . Cholecalciferol (VITAMIN D3) 2000 UNITS TABS Take  2,000 Int'l Units by mouth daily.    . finasteride (PROSCAR) 5 MG tablet Take 5 mg by mouth daily.    Marland Kitchen guaiFENesin (MUCINEX) 600 MG 12 hr tablet Take 1 tablet (600 mg total) by mouth 2 (two) times daily. 30 tablet 0  . KLOR-CON 10 10 MEQ tablet TAKE 1 TABLET BY MOUTH EVERY DAY 30 tablet 10  . lactose free nutrition (BOOST PLUS) LIQD Take 237 mLs by mouth 3 (three) times daily between meals. 30 Can 0  . metoprolol succinate (TOPROL-XL) 25 MG 24 hr tablet TAKE 1 TABLET BY MOUTH DAILY 30 tablet 10  . pantoprazole (PROTONIX) 40 MG tablet TAKE 1 TABLET BY MOUTH DAILY 30 tablet 6  . propylthiouracil (PTU) 50 MG tablet Take 100 mg by mouth 2 (two) times daily.     . rosuvastatin (CRESTOR) 20 MG tablet Take 0.5 tablets (10 mg total) by mouth once a week. Monday 7 tablet 0  . senna-docusate (SENOKOT-S) 8.6-50 MG per tablet Take 1 tablet by mouth at bedtime. 15 tablet 0  . tamsulosin (FLOMAX) 0.4 MG CAPS Take 0.4 mg by mouth at bedtime. At bedtime    . benzonatate (TESSALON) 100 MG capsule Take 1 capsule (100 mg total) by mouth 3 (three) times daily as needed for cough. (Patient not taking: Reported on 11/27/2014) 20 capsule 0  . sod citrate-citric acid (ORACIT) 490-640 MG/5ML SOLN Take 15 mLs by mouth 2 (two) times daily after a meal. Take 3 tablespoons in the morning and 2 tablespoons at night    . thiamine 100 MG tablet Take 1 tablet (100 mg total) by mouth daily. (Patient not taking: Reported on 11/21/2014) 30 tablet 0  . traMADol (ULTRAM) 50 MG tablet Take 1 tablet (50 mg total) by mouth every 12 (twelve) hours as needed for moderate pain or severe pain. (Patient not taking: Reported on 11/21/2014) 15 tablet 0   No current facility-administered medications for this encounter.    REVIEW OF SYSTEMS:  A 15 point review of systems is documented in the electronic medical record. This was obtained by the nursing staff. However, I reviewed this with the patient to discuss relevant findings and make appropriate  changes.  A comprehensive review of systems was negative.   PHYSICAL EXAM:  height is '5\' 9"'$  (1.753 m) and weight is 139 lb 11.2 oz (63.368 kg). His oral temperature is 97.8 F (36.6 C). His blood pressure is 125/65 and his pulse is 85. His respiration is 20 and oxygen saturation is 99%.   per med-onc: VHQ:IONGE, healthy, no distress and malnourished SKIN: skin color, texture, turgor are normal HEAD: Normocephalic, No masses, lesions, tenderness or abnormalities EYES: normal, PERRLA, Conjunctiva are pink and non-injected EARS: External ears normal, Canals clear OROPHARYNX:no exudate, no erythema and lips, buccal mucosa, and tongue normal  NECK: supple, no adenopathy, no JVD LYMPH: no palpable lymphadenopathy, no hepatosplenomegaly LUNGS: clear to auscultation , and palpation HEART: regular rate & rhythm, no murmurs and no  gallops ABDOMEN:abdomen soft, non-tender, normal bowel sounds and no masses or organomegaly BACK: Back symmetric, no curvature., No CVA tenderness EXTREMITIES:no joint deformities, effusion, or inflammation, no edema, no skin discoloration  NEURO: alert & oriented x 3 with fluent speech, no focal motor/sensory deficits In addition on my exam, the patient has a fluctuant non-tender nodule posterior to the left pinna suggestive of a lipoma  KPS = 90  100 - Normal; no complaints; no evidence of disease. 90   - Able to carry on normal activity; minor signs or symptoms of disease. 80   - Normal activity with effort; some signs or symptoms of disease. 1   - Cares for self; unable to carry on normal activity or to do active work. 60   - Requires occasional assistance, but is able to care for most of his personal needs. 50   - Requires considerable assistance and frequent medical care. 19   - Disabled; requires special care and assistance. 49   - Severely disabled; hospital admission is indicated although death not imminent. 48   - Very sick; hospital admission necessary;  active supportive treatment necessary. 10   - Moribund; fatal processes progressing rapidly. 0     - Dead  Karnofsky DA, Abelmann Flora, Craver LS and Burchenal [facility] [phone]) The use of the nitrogen mustards in the palliative treatment of carcinoma: with particular reference to bronchogenic carcinoma Cancer 1 634-56  LABORATORY DATA:  Lab Results  Component Value Date   WBC 8.5 11/21/2014   HGB 14.5 11/21/2014   HCT 43.5 11/21/2014   MCV 87.7 11/21/2014   PLT 264 11/21/2014   Lab Results  Component Value Date   NA 144 11/21/2014   K 4.0 11/21/2014   CL 109 10/28/2014   CO2 30* 11/21/2014   Lab Results  Component Value Date   ALT 9 11/21/2014   AST 12 11/21/2014   ALKPHOS 62 11/21/2014   BILITOT 0.56 11/21/2014     RADIOGRAPHY: Nm Pet Image Initial (pi) Skull Base To Thigh  11/08/2014   CLINICAL DATA:  Subsequent treatment strategy for right lower lobe lung mass. Staging. History of head neck cancer.  EXAM: NUCLEAR MEDICINE PET SKULL BASE TO THIGH  TECHNIQUE: 7.6 mCi F-18 FDG was injected intravenously. Full-ring PET imaging was performed from the skull base to thigh after the radiotracer. CT data was obtained and used for attenuation correction and anatomic localization.  FASTING BLOOD GLUCOSE:  Value: 152 mg/dl  COMPARISON:  10/22/2014 chest CT. 10/24/2014 abdominal pelvic CT. PET of 11/17/2013.  FINDINGS: NECK  Hypermetabolism within the floor of mouth which could be physiologic. This measures a S.U.V. max of 4.7 today. Decreased since the prior exam, where it measured a S.U.V. max of 7.5.  The previously described right submental node is no longer identified. No hypermetabolic cervical nodes.  CHEST  Right lower lobe lung mass which measures 5.9 x 3.7 cm and a S.U.V. max of 7.5 on image 35. This is enlarged from 5.2 x 3.0 cm on the prior exam. Decreased cavitation within.  No thoracic nodal hypermetabolism.  ABDOMEN/PELVIS  No areas of abnormal hypermetabolism.  SKELETON  No abnormal marrow  activity. Note is made of muscular activity throughout, mild.  CT IMAGES PERFORMED FOR ATTENUATION CORRECTION  Cerebral atrophy which is age expected. Minimal fluid the left maxillary sinus. Carotid atherosclerosis. Multiple small submental nodes which are primarily unchanged in size.  Marked thyroid enlargement is symmetric and grossly similar. Chest findings deferred to recent diagnostic CT.  Trace right pleural fluid is new. Prior median sternotomy. Left lower lobe pulmonary nodule is unchanged and below the resolution of PET. Bilateral renal collecting system stones. Bilateral renal cysts. Normal adrenal glands. Cholelithiasis. Moderate prostatomegaly. Urinary bladder extending minimally into an anterior pelvic wall hernia on image 191. Chronic. Presumed sebaceous cyst superficial the left gluteal musculature.  IMPRESSION: 1. Right lower lobe hypermetabolic lung mass, consistent with primary bronchogenic carcinoma. This is enlarged since 10/24/2014, with a new small right pleural effusion. 2. No thoracic nodal or extra thoracic metastasis. 3. Decrease in floor of mouth hypermetabolism which could represent response to therapy or be physiologic/artifactual. No residual or recurrent nodal metastasis from head neck primary. 4. Incidental findings, as detailed above.   Electronically Signed   By: Abigail Miyamoto M.D.   On: 11/08/2014 13:06      IMPRESSION: This patient is a very nice 81 yo man with stage T2b N0 M0 adenocarcinoma of the right lower lung.  He is not a surgical candidate given his age, but, may be eligible for SBRT radiation treatment.  PLAN:  Today, I talked to the patient and family about the findings and work-up thus far.  We discussed the natural history of early stage node-negative non-small cell lung cancer and general treatment, highlighting the role of radiotherapy in the management.  We discussed the available radiation techniques, including SBRT, and focused on the details of logistics and  delivery.  We reviewed the anticipated acute and late sequelae associated with radiation in this setting.  The patient was encouraged to ask questions that I answered to the best of my ability.  The patient would like to proceed with radiation and will be scheduled for CT simulation.  However, he indicated that Dr. Julien Nordmann recommended brain scan for staging.  This has not been done yet.  Once a brain scan is completed, we will review the results and proceed if clinically appropriate.  I spent more than 50% of today's visit in counseling and/or coordination of care.    ------------------------------------------------  Sheral Apley. Tammi Klippel, M.D.

## 2014-11-26 NOTE — Telephone Encounter (Signed)
Pt living facility called to confirm appt

## 2014-11-27 ENCOUNTER — Encounter: Payer: Self-pay | Admitting: Radiation Oncology

## 2014-11-27 ENCOUNTER — Ambulatory Visit: Admission: RE | Admit: 2014-11-27 | Payer: Medicare Other | Source: Ambulatory Visit

## 2014-11-27 ENCOUNTER — Ambulatory Visit
Admission: RE | Admit: 2014-11-27 | Discharge: 2014-11-27 | Disposition: A | Discharge status: Home / Self Care | Payer: Medicare Other | Source: Ambulatory Visit | Attending: Radiation Oncology | Admitting: Radiation Oncology

## 2014-11-27 VITALS — BP 125/65 | HR 85 | Temp 97.8°F | Resp 20 | Ht 69.0 in | Wt 139.7 lb

## 2014-11-27 DIAGNOSIS — Z87891 Personal history of nicotine dependence: Secondary | ICD-10-CM | POA: Insufficient documentation

## 2014-11-27 DIAGNOSIS — N400 Enlarged prostate without lower urinary tract symptoms: Secondary | ICD-10-CM | POA: Insufficient documentation

## 2014-11-27 DIAGNOSIS — Z510 Encounter for antineoplastic radiation therapy: Secondary | ICD-10-CM | POA: Insufficient documentation

## 2014-11-27 DIAGNOSIS — C3431 Malignant neoplasm of lower lobe, right bronchus or lung: Secondary | ICD-10-CM | POA: Diagnosis not present

## 2014-11-27 DIAGNOSIS — I2510 Atherosclerotic heart disease of native coronary artery without angina pectoris: Secondary | ICD-10-CM | POA: Diagnosis not present

## 2014-11-27 DIAGNOSIS — I509 Heart failure, unspecified: Secondary | ICD-10-CM | POA: Diagnosis not present

## 2014-11-27 DIAGNOSIS — E785 Hyperlipidemia, unspecified: Secondary | ICD-10-CM | POA: Diagnosis not present

## 2014-11-27 DIAGNOSIS — K219 Gastro-esophageal reflux disease without esophagitis: Secondary | ICD-10-CM | POA: Diagnosis not present

## 2014-11-27 DIAGNOSIS — Z951 Presence of aortocoronary bypass graft: Secondary | ICD-10-CM | POA: Insufficient documentation

## 2014-11-27 NOTE — Progress Notes (Addendum)
Thoracic Location of Tumor / Histology: Right lower lobe Lung   Patient presented months ago with symptoms of: Shortness breath,cough,weight loss,fatigue  Biopsies of  (if applicable) revealed: Diagnosis 10/24/14: Lung, needle/core biopsy(ies), right lower lobe- ADENOCARCINOMA.  Tobacco/Marijuana/Snuff/ETOH use: remote hx smoking quit 1972,never used smokeless tobacco,no alcohol use,   Past/Anticipated interventions by cardiothoracic surgery, if any: no  Past/Anticipated interventions by medical oncology, if any: Dr. Julien Nordmann 11/21/14 note, referral  To radiation  F/u  12/13/14 Signs/Symptoms  Weight changes, if any: 20 in last  2 month  Respiratory complaints, if any: Mild sob  Hemoptysis, if any:No  Pain issues, if any: NO    SAFETY ISSUES:YES, unsteady in w/c,   Prior radiation? NO  Pacemaker/ICD?  NO  Is the patient on methotrexate? NO  Current Complaints / other details: Single,no children, nephew in [place] has HCPOA,   HX head and neck cancer  S/p  resection  Of the floor of the mouth,squamous cell ca,, And radical neck  dissection  By Dr. Constance Holster 04/19/14, HX AV stenosis, CAD,CABGx 4  36 y ago,b/l carotid stenosis  HOH,has B/L hearing aids,  Has knot size golf ball on back left ear, has grown in last 6-7 months stated , feet edema,dry skin, Residing at [facility] facility  Allergies: Ace inhibitor=intolerance, Lipitor=leg weakness BP 125/65 mmHg  Pulse 85  Temp(Src) 97.8 F (36.6 C) (Oral)  Resp 20  Ht '5\' 9"'$  (1.753 m)  Wt   SpO2 99%  Wt Readings from Last 3 Encounters:  11/21/14 134 lb 11.2 oz (61.1 kg)  10/27/14 152 lb 5.4 oz (69.1 kg)  10/05/14 161 lb (73.029 kg)  weight 13*9.7lb up with assistance today

## 2014-11-28 ENCOUNTER — Telehealth: Payer: Self-pay | Admitting: *Deleted

## 2014-11-28 ENCOUNTER — Ambulatory Visit: Admission: RE | Admit: 2014-11-28 | Payer: Medicare Other | Source: Ambulatory Visit | Admitting: Radiation Oncology

## 2014-11-28 ENCOUNTER — Ambulatory Visit: Payer: Medicare Other

## 2014-11-28 NOTE — Telephone Encounter (Signed)
Called patient to inform of MRI and sim, lvm for a return call

## 2014-11-28 NOTE — Telephone Encounter (Signed)
Called [facility] after call lost.  Nurse doesn't answer.  Operator will instruct her to call [place].

## 2014-11-28 NOTE — Telephone Encounter (Signed)
[facility] TO INFORM OF MRI ON 12-05-14 AND SIM ON 12-13-14 @ 11 AM, SPOKE WITH SABRINA OF [facility] AND GAVE HER THIS INFO.

## 2014-11-28 NOTE — Telephone Encounter (Signed)
Called [phone] as requested from voicemail from [facility] requesting  Yesterday's visit information.  Call lost.

## 2014-11-28 NOTE — Telephone Encounter (Signed)
Second voicemail left at 1220 from [facility] requesting return call to [facility] at [phone].  Called Dawn who asked for update and any new orders from yesterday's visit so Ritta Slot can update patient records.  Seen by Dr. Tammi Klippel RT.  Before transferring call, Dawn said no.  Next visit they will send consult forms that need to be completed for all outside appointments.

## 2014-11-29 ENCOUNTER — Encounter (HOSPITAL_COMMUNITY): Payer: Self-pay

## 2014-11-29 ENCOUNTER — Ambulatory Visit: Payer: Medicare Other | Admitting: Radiation Oncology

## 2014-12-05 ENCOUNTER — Ambulatory Visit (HOSPITAL_COMMUNITY)
Admission: RE | Admit: 2014-12-05 | Discharge: 2014-12-05 | Disposition: A | Discharge status: Home / Self Care | Payer: Medicare Other | Source: Ambulatory Visit | Attending: Radiation Oncology | Admitting: Radiation Oncology

## 2014-12-05 DIAGNOSIS — I739 Peripheral vascular disease, unspecified: Secondary | ICD-10-CM | POA: Insufficient documentation

## 2014-12-05 DIAGNOSIS — C3431 Malignant neoplasm of lower lobe, right bronchus or lung: Secondary | ICD-10-CM

## 2014-12-05 DIAGNOSIS — G319 Degenerative disease of nervous system, unspecified: Secondary | ICD-10-CM | POA: Insufficient documentation

## 2014-12-05 DIAGNOSIS — C3490 Malignant neoplasm of unspecified part of unspecified bronchus or lung: Secondary | ICD-10-CM | POA: Diagnosis present

## 2014-12-05 MED ORDER — GADOBENATE DIMEGLUMINE 529 MG/ML IV SOLN
15.0000 mL | Freq: Once | INTRAVENOUS | Status: AC | PRN
  Administered 2014-12-05: 13 mL via INTRAVENOUS

## 2014-12-10 ENCOUNTER — Ambulatory Visit: Payer: Medicare Other | Admitting: Radiation Oncology

## 2014-12-13 ENCOUNTER — Encounter: Payer: Self-pay | Admitting: Physician Assistant

## 2014-12-13 ENCOUNTER — Ambulatory Visit
Admission: RE | Admit: 2014-12-13 | Discharge: 2014-12-13 | Disposition: A | Discharge status: Home / Self Care | Payer: Medicare Other | Source: Ambulatory Visit | Attending: Radiation Oncology | Admitting: Radiation Oncology

## 2014-12-13 ENCOUNTER — Other Ambulatory Visit (HOSPITAL_BASED_OUTPATIENT_CLINIC_OR_DEPARTMENT_OTHER): Payer: Medicare Other

## 2014-12-13 ENCOUNTER — Encounter: Payer: Self-pay | Admitting: *Deleted

## 2014-12-13 ENCOUNTER — Ambulatory Visit (HOSPITAL_BASED_OUTPATIENT_CLINIC_OR_DEPARTMENT_OTHER): Payer: Medicare Other | Admitting: Physician Assistant

## 2014-12-13 VITALS — BP 135/56 | HR 82 | Temp 98.0°F | Resp 18 | Ht 69.0 in | Wt 138.4 lb

## 2014-12-13 DIAGNOSIS — C3431 Malignant neoplasm of lower lobe, right bronchus or lung: Secondary | ICD-10-CM | POA: Diagnosis present

## 2014-12-13 DIAGNOSIS — Z510 Encounter for antineoplastic radiation therapy: Secondary | ICD-10-CM | POA: Diagnosis not present

## 2014-12-13 DIAGNOSIS — R140 Abdominal distension (gaseous): Secondary | ICD-10-CM | POA: Diagnosis not present

## 2014-12-13 DIAGNOSIS — C3492 Malignant neoplasm of unspecified part of left bronchus or lung: Secondary | ICD-10-CM

## 2014-12-13 LAB — COMPREHENSIVE METABOLIC PANEL (CC13)
ALT: 8 U/L (ref 0–55)
ANION GAP: 6 meq/L (ref 3–11)
AST: 14 U/L (ref 5–34)
Albumin: 3.1 g/dL — ABNORMAL LOW (ref 3.5–5.0)
Alkaline Phosphatase: 63 U/L (ref 40–150)
BILIRUBIN TOTAL: 0.82 mg/dL (ref 0.20–1.20)
BUN: 24.5 mg/dL (ref 7.0–26.0)
CO2: 28 mEq/L (ref 22–29)
CREATININE: 1 mg/dL (ref 0.7–1.3)
Calcium: 9.4 mg/dL (ref 8.4–10.4)
Chloride: 107 mEq/L (ref 98–109)
EGFR: 65 mL/min/{1.73_m2} — ABNORMAL LOW (ref >90)
Glucose: 131 mg/dl (ref 70–140)
Potassium: 4.2 mEq/L (ref 3.5–5.1)
Sodium: 142 mEq/L (ref 136–145)
Total Protein: 6.3 g/dL — ABNORMAL LOW (ref 6.4–8.3)

## 2014-12-13 LAB — CBC WITH DIFFERENTIAL/PLATELET
BASO%: 0.6 % (ref 0.0–2.0)
Basophils Absolute: 0.1 10*3/uL (ref 0.0–0.1)
EOS ABS: 0.1 10*3/uL (ref 0.0–0.5)
EOS%: 1 % (ref 0.0–7.0)
HEMATOCRIT: 42.2 % (ref 38.4–49.9)
HGB: 14 g/dL (ref 13.0–17.1)
LYMPH#: 1.1 10*3/uL (ref 0.9–3.3)
LYMPH%: 13.1 % — AB (ref 14.0–49.0)
MCH: 28.6 pg (ref 27.2–33.4)
MCHC: 33.2 g/dL (ref 32.0–36.0)
MCV: 85.9 fL (ref 79.3–98.0)
MONO#: 0.6 10*3/uL (ref 0.1–0.9)
MONO%: 6.8 % (ref 0.0–14.0)
NEUT%: 78.5 % — ABNORMAL HIGH (ref 39.0–75.0)
NEUTROS ABS: 6.9 10*3/uL — AB (ref 1.5–6.5)
PLATELETS: 312 10*3/uL (ref 140–400)
RBC: 4.91 10*6/uL (ref 4.20–5.82)
RDW: 15 % — ABNORMAL HIGH (ref 11.0–14.6)
WBC: 8.8 10*3/uL (ref 4.0–10.3)

## 2014-12-13 NOTE — Progress Notes (Addendum)
No images are attached to the encounter. No scans are attached to the encounter. No scans are attached to the encounter. [place], MD [address] [place] [address]  DIAGNOSIS: Adenocarcinoma of the right lower lobe of lung, Stage 2   Staging form: Lung, AJCC 7th Edition     Clinical stage from 11/21/2014: Stage IIA (T2b, N0, M0) - Signed by Curt Bears, MD on 11/21/2014  PRIOR THERAPY: none  CURRENT THERAPY: Curative radiation therapy  INTERVAL HISTORY: Austin Ward 81 y.o. male returns for a scheduled regular office visit for followup of his recently diagnosed stage IIa non-small cell lung cancer, adenocarcinoma. He recently had an MRI of the brain and is here to discuss the results. He has been evaluated by radiation oncology and pending the results of his MRI is to have curative radiation therapy in the form of 5 fractions over approximately a week and a half. He complains of some indigestion both voiced no other specific complaints. He denied fever, chills, cough or shortness of breath. No nausea, vomiting, diarrhea or constipation. Denies any significant weight loss or night sweats.  MEDICAL HISTORY: Past Medical History  Diagnosis Date  . CAD (coronary artery disease)   . S/P CABG x 4 02/27/1994    LIMA to LAD,SVG to OM,SVG to 2nd OM,SVG to RCA-Dr. Cyndia Bent  . Heart murmur   . Carotid arterial disease     07/04/12 doppler:right ICA 50-69%,left bulb & ICA 0-49%  . Systemic hypertension   . Dyslipidemia   . Bilateral carotid artery stenosis 04/16/2013  . Aortic valve stenosis 04/16/2013    1.5 cm by echo March 2014  . Essential hypertension 04/16/2013    Did not tolerate higher doses of statins. Satisfactory control on once weekly Crestor   . Occasional tremors     mostly right hand  . Enlarged prostate   . GERD (gastroesophageal reflux disease)   . Cancer     oral cancer  . History of  shingles 2005  . Hard of hearing   . Hyperthyroidism   . CHF (congestive heart failure)     ALLERGIES:  is allergic to ace inhibitors and lipitor.  MEDICATIONS:  Current Outpatient Prescriptions  Medication Sig Dispense Refill  . acetaminophen (TYLENOL) 500 MG tablet Take 1 tablet (500 mg total) by mouth every 8 (eight) hours as needed for mild pain, moderate pain or fever. 30 tablet 0  . benzonatate (TESSALON) 100 MG capsule Take 1 capsule (100 mg total) by mouth 3 (three) times daily as needed for cough. (Patient not taking: Reported on 11/27/2014) 20 capsule 0  . Cholecalciferol (VITAMIN D3) 2000 UNITS TABS Take 2,000 Int'l Units by mouth daily.    . finasteride (PROSCAR) 5 MG tablet Take 5 mg by mouth daily.    Marland Kitchen guaiFENesin (MUCINEX) 600 MG 12 hr tablet Take 1 tablet (600 mg total) by mouth 2 (two) times daily. 30 tablet 0  . KLOR-CON 10 10 MEQ tablet TAKE 1 TABLET BY MOUTH EVERY DAY 30 tablet 10  . lactose free nutrition (BOOST PLUS) LIQD Take 237 mLs by mouth 3 (three) times daily between meals. 30 Can 0  . metoprolol succinate (TOPROL-XL) 25 MG 24 hr tablet TAKE 1 TABLET BY MOUTH DAILY 30 tablet 10  . pantoprazole (PROTONIX) 40 MG tablet TAKE 1 TABLET BY MOUTH DAILY 30 tablet 6  . propylthiouracil (PTU) 50 MG tablet Take 100 mg by mouth 2 (two)  times daily.     . rosuvastatin (CRESTOR) 20 MG tablet Take 0.5 tablets (10 mg total) by mouth once a week. Monday 7 tablet 0  . senna-docusate (SENOKOT-S) 8.6-50 MG per tablet Take 1 tablet by mouth at bedtime. 15 tablet 0  . sod citrate-citric acid (ORACIT) 490-640 MG/5ML SOLN Take 15 mLs by mouth 2 (two) times daily after a meal. Take 3 tablespoons in the morning and 2 tablespoons at night    . tamsulosin (FLOMAX) 0.4 MG CAPS Take 0.4 mg by mouth at bedtime. At bedtime    . thiamine 100 MG tablet Take 1 tablet (100 mg total) by mouth daily. (Patient not taking: Reported on 11/21/2014) 30 tablet 0  . traMADol (ULTRAM) 50 MG tablet Take 1  tablet (50 mg total) by mouth every 12 (twelve) hours as needed for moderate pain or severe pain. (Patient not taking: Reported on 11/21/2014) 15 tablet 0   No current facility-administered medications for this visit.    SURGICAL HISTORY:  Past Surgical History  Procedure Laterality Date  . Coronary artery bypass graft  02/27/1994    LIMA to LAD,SVG to OM,SVG to 2nd OM,SVG to RCA  . Intraocular lens insertion  2000  . Cardiac catheterization  02/25/1994  . US echocardiography  07/04/2012    mild to mod AOV stenosis,mitral annular ca+,LA mildly to mod. dilated  . Nm myoview ltd  04/01/2010    no ischemia, EF 60%  . Eye surgery Bilateral     cataract surgery  . Tonsillectomy    . Colonoscopy    . Mouth biopsy  12/13/2013    DR ROSEN  . Floor of mouth biopsy N/A 12/13/2013    Procedure: EXCISION FLOOR OF MOUTH TUMOR/SUBMANDIBULAR DUCT REROUTING/EXCISIONAL BIOPSY OF LYMPHNODE;  Surgeon: Izora Gala, MD;  Location: [facility];  Service: ENT;  Laterality: N/A;  . Radical neck dissection Left 04/19/2014    Procedure: LEFT NECK DISSECTION;  Surgeon: Izora Gala, MD;  Location: [facility];  Service: ENT;  Laterality: Left;    REVIEW OF SYSTEMS:  Review of Systems  Constitutional: Negative for fever, chills, weight loss, malaise/fatigue and diaphoresis.  HENT: Negative for congestion, ear discharge, ear pain, hearing loss, nosebleeds, sore throat and tinnitus.   Eyes: Negative for blurred vision, double vision, photophobia, pain, discharge and redness.  Respiratory: Negative for cough, hemoptysis, sputum production, shortness of breath, wheezing and stridor.   Cardiovascular: Negative for chest pain, palpitations, orthopnea, claudication, leg swelling and PND.  Gastrointestinal: Positive for heartburn. Negative for nausea, vomiting, abdominal pain, diarrhea, constipation, blood in stool and melena.  Genitourinary: Negative.   Musculoskeletal: Negative.   Skin: Negative.   Neurological: Negative for  dizziness, tingling, focal weakness, seizures, weakness and headaches.  Endo/Heme/Allergies: Does not bruise/bleed easily.  Psychiatric/Behavioral: Negative for depression. The patient is not nervous/anxious and does not have insomnia.      PHYSICAL EXAMINATION: Physical Exam  Constitutional: He is oriented to person, place, and time and well-developed, well-nourished, and in no distress.  Comfortably seated in a wheel chair. Able to move all extremities  HENT:  Head: Normocephalic and atraumatic.  Mouth/Throat: Oropharynx is clear and moist.  Eyes: Pupils are equal, round, and reactive to light.  Neck: Normal range of motion. Neck supple. No JVD present. No tracheal deviation present. No thyromegaly present.  Cardiovascular: Normal rate, regular rhythm, normal heart sounds and intact distal pulses.  Exam reveals no gallop and no friction rub.   No murmur heard. Pulmonary/Chest: Effort normal and  breath sounds normal. No respiratory distress. He has no wheezes. He has no rales.  Abdominal: Soft. Bowel sounds are normal. He exhibits no distension and no mass. There is no tenderness.  Musculoskeletal: Normal range of motion. He exhibits no edema or tenderness.  Lymphadenopathy:    He has no cervical adenopathy.  Neurological: He is alert and oriented to person, place, and time. He has normal reflexes. Gait normal.  Skin: Skin is warm and dry. No rash noted.    ECOG PERFORMANCE STATUS: 2 - Symptomatic, <50% confined to bed  Blood pressure 135/56, pulse 82, temperature 98 F (36.7 C), temperature source Oral, resp. rate 18, height '5\' 9"'$  (1.753 m), weight 138 lb 6.4 oz (62.778 kg), SpO2 100 %.  LABORATORY DATA: Lab Results  Component Value Date   WBC 8.8 12/13/2014   HGB 14.0 12/13/2014   HCT 42.2 12/13/2014   MCV 85.9 12/13/2014   PLT 312 12/13/2014      Chemistry      Component Value Date/Time   NA 142 12/13/2014 1153   NA 138 10/28/2014 0343   K 4.2 12/13/2014 1153   K 4.2  10/28/2014 0343   CL 109 10/28/2014 0343   CO2 28 12/13/2014 1153   CO2 24 10/28/2014 0343   BUN 24.5 12/13/2014 1153   BUN 21* 10/28/2014 0343   CREATININE 1.0 12/13/2014 1153   CREATININE 1.02 10/28/2014 0343   CREATININE 1.20 10/09/2014 0939      Component Value Date/Time   CALCIUM 9.4 12/13/2014 1153   CALCIUM 8.6* 10/28/2014 0343   ALKPHOS 63 12/13/2014 1153   ALKPHOS 49 10/25/2014 0509   AST 14 12/13/2014 1153   AST 18 10/25/2014 0509   ALT 8 12/13/2014 1153   ALT 12* 10/25/2014 0509   BILITOT 0.82 12/13/2014 1153   BILITOT 0.9 10/25/2014 0509       RADIOGRAPHIC STUDIES:  Mr Jeri Cos Wo Contrast  12/05/2014   CLINICAL DATA:  Lung cancer.  Staging.  EXAM: MRI HEAD WITHOUT AND WITH CONTRAST  TECHNIQUE: Multiplanar, multiecho pulse sequences of the brain and surrounding structures were obtained without and with intravenous contrast.  CONTRAST:  67m MULTIHANCE GADOBENATE DIMEGLUMINE 529 MG/ML IV SOLN  COMPARISON:  CT head 10/24/2014.  FINDINGS: Generalized atrophy. Moderately advanced small vessel disease. No acute stroke, acute hemorrhage, intracranial mass lesion, or extra-axial fluid. Hydrocephalus ex vacuo. Flow voids are maintained. No significant foci of chronic hemorrhage.  Postcontrast, no abnormal enhancement of brain or meninges. Calvarium intact. No sinus or mastoid disease. Negative orbits.  In the reromastoid region on the LEFT, a 15 x 23 mm soft tissue mass with mild restriction is observed. Sebaceous cyst is most likely. No associated osseous reaction. Similar size compared with prior CT.  IMPRESSION: Atrophy and small vessel disease. This appears similar to priors. No acute intracranial findings.  No intracranial metastatic disease is observed.   Electronically Signed   By: JStaci RighterM.D.   On: 12/05/2014 13:18     ASSESSMENT/PLAN:  No problem-specific assessment & plan notes found for this encounter.  patient is a pleasant 81year old white male recently  diagnosed with stage IIA (T2b, N0, M0) non-small cell lung cancer adenocarcinoma. This was diagnosed in June 2016. The MRI of the brain was negative for brain metastasis. The patient was discussed with and also seen by Dr. MJulien Nordmann The plan will be for him to complete his course of curative radiation therapy as planned for 5 fractions. He will then follow-up with  [place] in 3 months with a restaging CT scan of the chest with contrast to reevaluate his disease. For the indigestion, the patient may try an over-the-counter medication such as Prilosec or Nexium. If the issue is excess gas he may take Gas-X or Phazyme. The patient and his family members voiced understanding, and are in agreement with the plan.  Awilda Metro E, PA-C 12/13/2014  All questions were answered. The patient knows to call the clinic with any problems, questions or concerns. We can certainly see the patient much sooner if necessary.  ADDENDUM: Hematology/Oncology Attending: I had a face to face encounter with the patient. I recommended his care plan. This is a very pleasant 81 years old white male who was recently diagnosed with a stage IIa non-small cell lung cancer, adenocarcinoma. The imaging studies showed no evidence for metastatic disease to the brain. The patient was seen by Dr. Tammi Klippel and expected to start curative radiotherapy to the right lower lobe lung mass. Because of his age and comorbidities and on see a need for the patient to receive concurrent chemotherapy with his radiation which is expected to be done in 5 fractions only. I will arrange for the patient to come back for follow-up visit in 3 months for reevaluation and repeat CT scan of the chest for restaging of his disease. For the abdominal bloating he was advised to take Gas-X or Phazyme. The patient was advised to call immediately if he has any concerning symptoms in the interval.  Disclaimer: This note was dictated with voice recognition software. Similar  sounding words can inadvertently be transcribed and may not be corrected upon review. Eilleen Kempf., MD 12/15/2014

## 2014-12-13 NOTE — Progress Notes (Signed)
  Radiation Oncology         [phone] ________________________________  Name: Austin Ward MRN: [id number]  Date: 12/13/2014  DOB: [birth year]  STEREOTACTIC BODY RADIOTHERAPY SIMULATION AND TREATMENT PLANNING NOTE    ICD-9-CM ICD-10-CM   1. Adenocarcinoma of th right lower lobe of lung, Stage 2 162.5 C34.31     DIAGNOSIS:  81 yo man with stage T2b N0 M0 adenocarcinoma of the right lower lung - Stage II  NARRATIVE:  The patient was brought to the [facility].  Identity was confirmed.  All relevant records and images related to the planned course of therapy were reviewed.  The patient freely provided informed written consent to proceed with treatment after reviewing the details related to the planned course of therapy. The consent form was witnessed and verified by the simulation staff.  Then, the patient was set-up in a stable reproducible  supine position for radiation therapy.  A BodyFix immobilization pillow was fabricated for reproducible positioning.  Then I personally applied the abdominal compression paddle to limit respiratory excursion.  4D respiratoy motion management CT images were obtained.  Surface markings were placed.  The CT images were loaded into the planning software.  Then, using Cine, MIP, and standard views, the internal target volume (ITV) and planning target volumes (PTV) were delinieated, and avoidance structures were contoured.  Treatment planning then occurred.  The radiation prescription was entered and confirmed.  A total of two complex treatment devices were fabricated in the form of the BodyFix immobilization pillow and a neck accuform cushion.  I have requested : 3D Simulation  I have requested a DVH of the following structures: Heart, Lungs, Esophagus, Chest Wall, Brachial Plexus, Major Blood Vessels, and targets.  PLAN:  The patient will receive 60 Gy in 5 fractions.  ________________________________  Sheral Apley Tammi Klippel, M.D.

## 2014-12-13 NOTE — Progress Notes (Signed)
Oncology Nurse Navigator Documentation  Oncology Nurse Navigator Flowsheets 12/13/2014  Navigator Encounter Type -  Patient Visit Type Radonc/I noticed that Austin Ward is late for his rad onc treatment.  I called [name] to enquire about patient.  Nurse stated he was already on his way.  I called Rad Onc to update.   Treatment Phase First Radiation Tx  Barriers/Navigation Needs -  Education -  Interventions -  Time Spent with Patient 15

## 2014-12-14 ENCOUNTER — Telehealth: Payer: Self-pay

## 2014-12-14 ENCOUNTER — Telehealth: Payer: Self-pay | Admitting: Internal Medicine

## 2014-12-14 NOTE — Patient Instructions (Signed)
The MRI of your brain was negative for brain metastasis. Complete your planned curative radiation therapy as scheduled. Follow-up in 3 months with a restaging CT scan of your chest to reevaluate your disease

## 2014-12-14 NOTE — Telephone Encounter (Signed)
Nursing home aware of November appointments

## 2014-12-19 ENCOUNTER — Encounter: Payer: Self-pay | Admitting: *Deleted

## 2014-12-19 NOTE — Progress Notes (Signed)
[facility] [place] Psychosocial Distress Screening Clinical Social Work  Clinical Social Work was referred by distress screening protocol.  The patient scored a 6 on the Psychosocial Distress Thermometer which indicates moderate distress. Clinical Social Worker attempted to contact patient by phone to assess for distress and other psychosocial needs.   ONCBCN DISTRESS SCREENING 11/27/2014  Screening Type Initial Screening  Distress experienced in past week (1-10) 6  Practical problem type Insurance  Information Concerns Type Lack of info about treatment;Lack of info about diagnosis  Physical Problem type Getting around;Bathing/dressing;Constipation/diarrhea;Swollen arms/legs  Physician notified of physical symptoms Yes  Referral to clinical social work Yes  Referral to dietition Yes    Clinical Social Worker follow up needed: Yes.    If yes, follow up plan:  CSW left voicemail requesting patient return call when convenient.  Polo Riley, MSW, LCSW, OSW-C Clinical Social Worker [facility] [phone]

## 2014-12-26 ENCOUNTER — Ambulatory Visit: Payer: Medicare Other | Admitting: Podiatry

## 2014-12-26 DIAGNOSIS — Z510 Encounter for antineoplastic radiation therapy: Secondary | ICD-10-CM | POA: Diagnosis not present

## 2014-12-28 ENCOUNTER — Ambulatory Visit: Admission: RE | Admit: 2014-12-28 | Payer: Medicare Other | Source: Ambulatory Visit | Admitting: Radiation Oncology

## 2014-12-31 ENCOUNTER — Ambulatory Visit: Payer: Medicare Other | Admitting: Radiation Oncology

## 2015-01-02 ENCOUNTER — Ambulatory Visit: Payer: Medicare Other | Admitting: Radiation Oncology

## 2015-01-03 DEATH — deceased

## 2015-01-04 ENCOUNTER — Ambulatory Visit: Payer: Medicare Other | Admitting: Radiation Oncology

## 2015-01-08 ENCOUNTER — Ambulatory Visit: Payer: Medicare Other | Admitting: Radiation Oncology

## 2015-01-09 ENCOUNTER — Ambulatory Visit: Payer: Medicare Other | Admitting: Radiation Oncology

## 2015-02-19 ENCOUNTER — Encounter: Payer: Self-pay | Admitting: Radiation Oncology

## 2015-02-19 NOTE — Progress Notes (Signed)
  Radiation Oncology         [phone]) [phone] ________________________________  Name: Austin Ward MRN: [id number]  Date: 02/19/2015  DOB: 12-10-1923  Chart Note:  This patient was in the process of being set up for radiation therapy.  However, due to declining health and concern about possible side effects, he elected not to proceed after radiation planning.  Given his age, I think that is one reasonable option.  I am happy to see him back at any time if he would like to pursue radiation.  ________________________________  Sheral Apley Tammi Klippel, M.D.

## 2015-03-15 ENCOUNTER — Other Ambulatory Visit: Payer: Self-pay | Admitting: *Deleted

## 2015-03-15 DIAGNOSIS — C3431 Malignant neoplasm of lower lobe, right bronchus or lung: Secondary | ICD-10-CM

## 2015-03-18 ENCOUNTER — Ambulatory Visit (HOSPITAL_COMMUNITY): Admission: RE | Admit: 2015-03-18 | Payer: No Typology Code available for payment source | Source: Ambulatory Visit

## 2015-03-18 ENCOUNTER — Other Ambulatory Visit: Payer: Medicare Other

## 2015-03-20 ENCOUNTER — Ambulatory Visit: Payer: Medicare Other | Admitting: Internal Medicine

## 2016-05-29 IMAGING — CT NM PET TUM IMG INITIAL (PI) SKULL BASE T - THIGH
8 series · 25 of 25 positions shown · non-contrast
Comparison: CT 11/08/2013

CLINICAL DATA: Initial treatment strategy for head and neck
carcinoma.

EXAM:
NUCLEAR MEDICINE PET SKULL BASE TO THIGH
TECHNIQUE: 10.1 mCi F-18 FDG was injected intravenously. Full-ring PET imaging
was performed from the skull base to thigh after the radiotracer. CT
data was obtained and used for attenuation correction and anatomic
localization.
FASTING BLOOD GLUCOSE:  Value: 93 mg/dl

[Series 3: pet hn_sk_thigh ac · axial · 5.0mm · 4.07mm/px · z∈[-912,+60]mm · 4 of 244 slices shown]
[im 1/244]
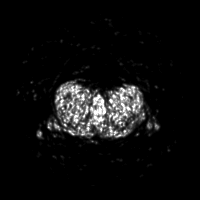
[im 82/244]
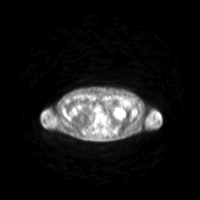
[im 163/244]
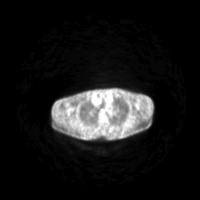
[im 244/244]
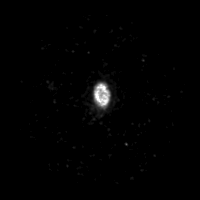

[Series 4: ct hn_sk_th 5.0 b31f · axial · 5.0mm · 0.81mm/px · z∈[-912,+60]mm · 5 of 244 slices shown]
[im 1/244]
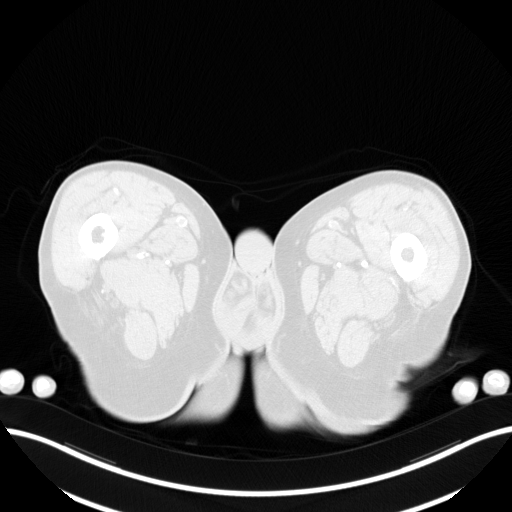
[im 61/244]
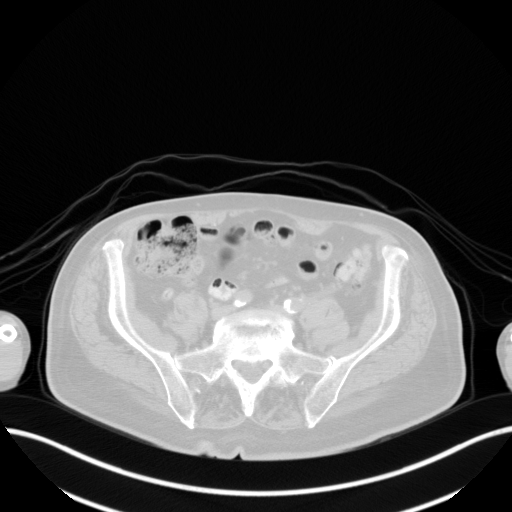
[im 122/244]
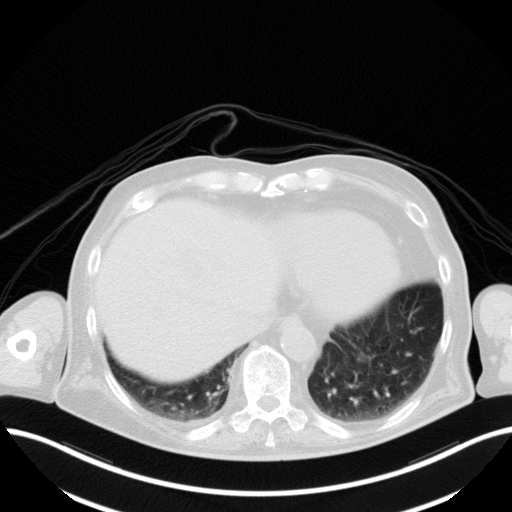
[im 183/244  brain]
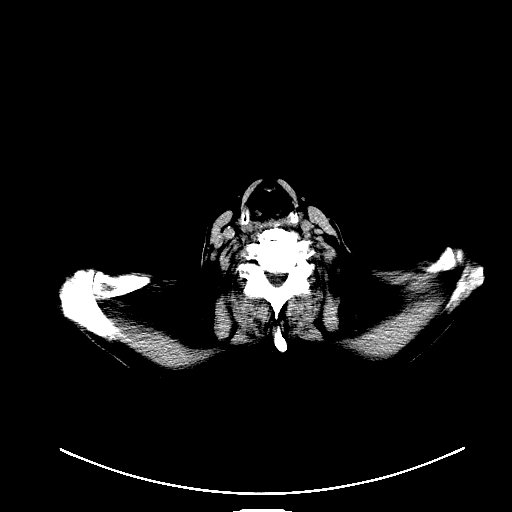
[im 244/244  bone]
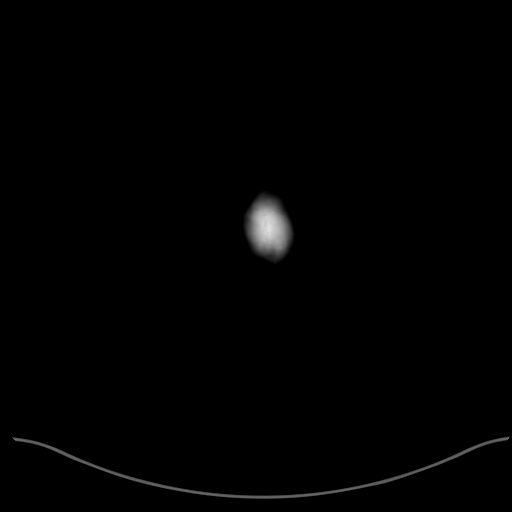

[Series 7: pet hn_sk_thigh nac · axial · 5.0mm · 4.07mm/px · z∈[-912,+60]mm · 5 of 244 slices shown]
[im 1/244]
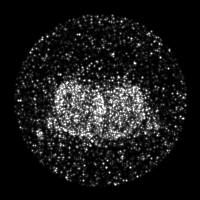
[im 61/244]
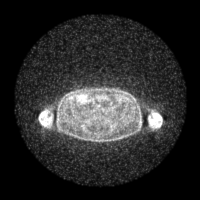
[im 122/244]
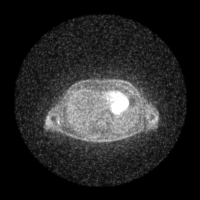
[im 183/244]
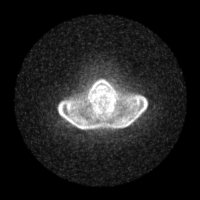
[im 244/244]
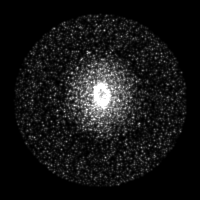

[Series 8: ct hn_sk_th 5.0 b70f (id)_bone · axial · 5.0mm · 0.72mm/px · z∈[-488,-200]mm · 2 of 73 slices shown]
[im 1/73  bone]
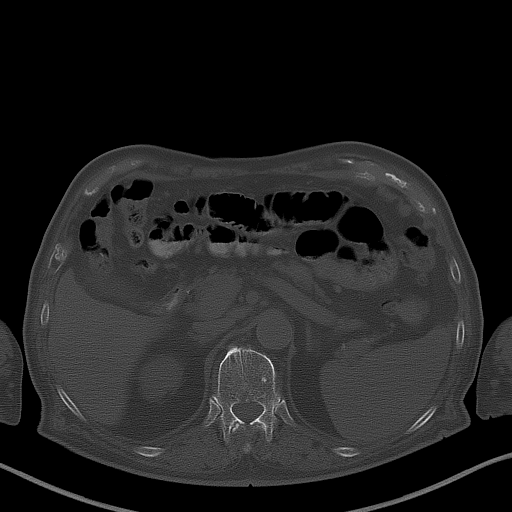
[im 73/73  bone]
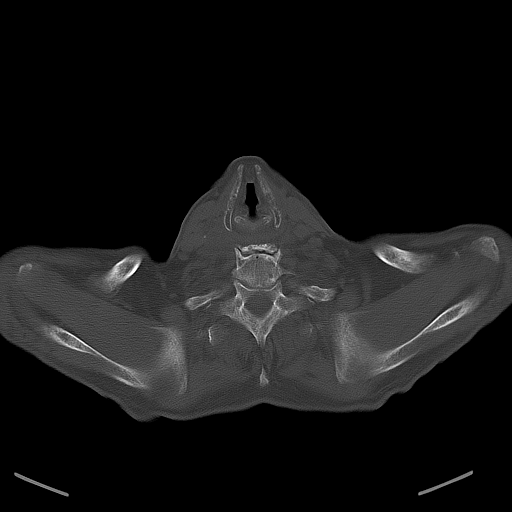

[Series 604: mip collection<mip range> · coronal · 2.02mm/px · 1 of 32 slices shown]
[im 1/32]
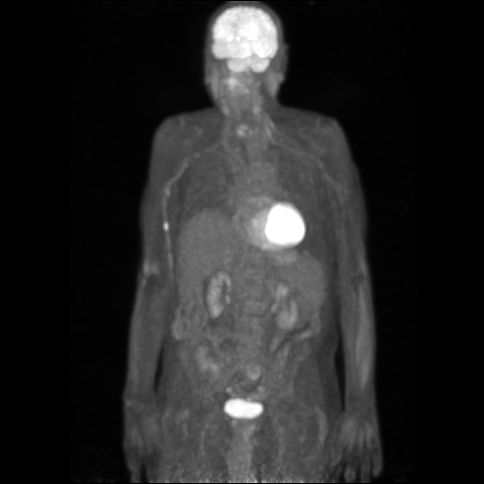

[Series 605: range-ct hn_sk_th 5.0 (id)<alpha range> · 2 of 68 slices shown (1 of 2)]
[im 1/68]
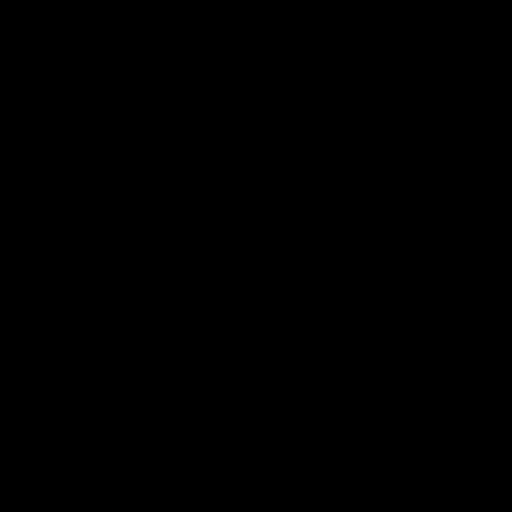
[im 68/68]
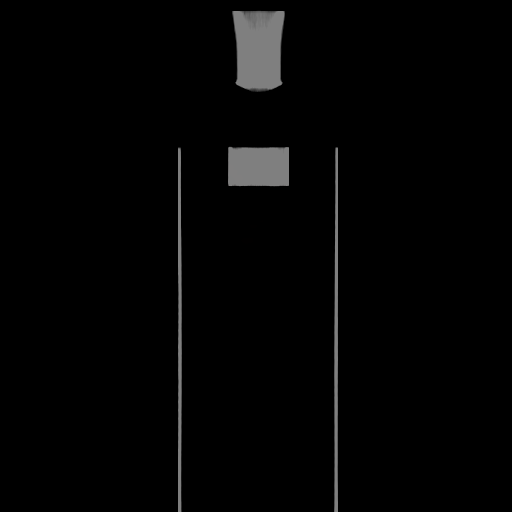

[Series 606: range-ct hn_sk_th 5.0 (id)<alpha range> · 5 of 205 slices shown (2 of 2)]
[im 1/205]
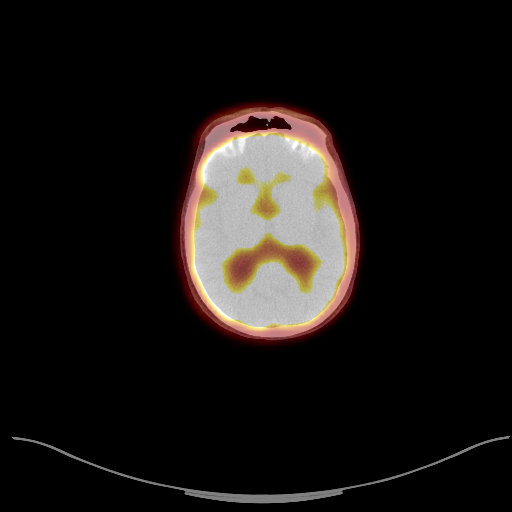
[im 52/205]
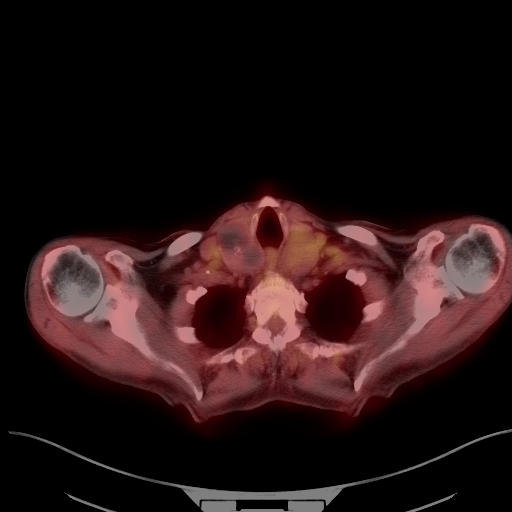
[im 103/205]
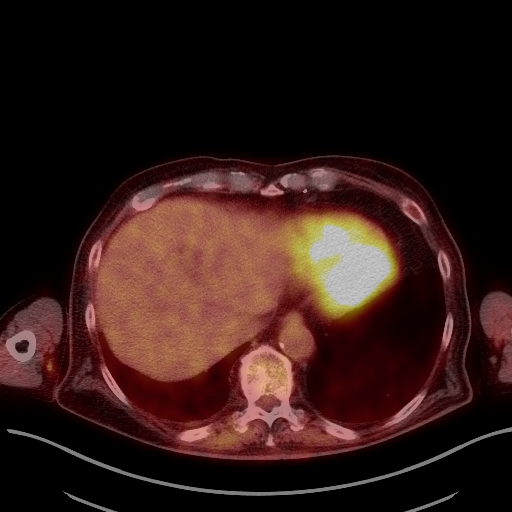
[im 154/205]
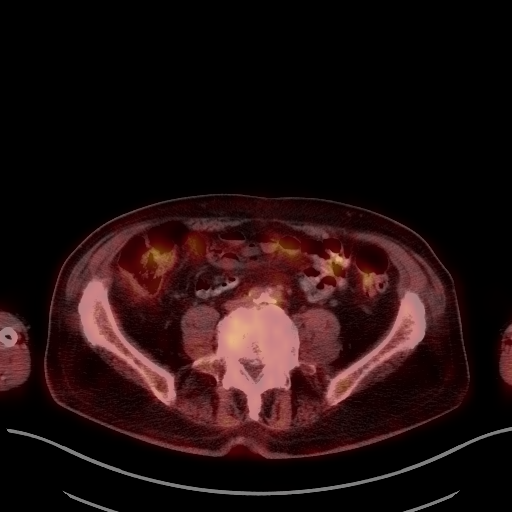
[im 205/205]
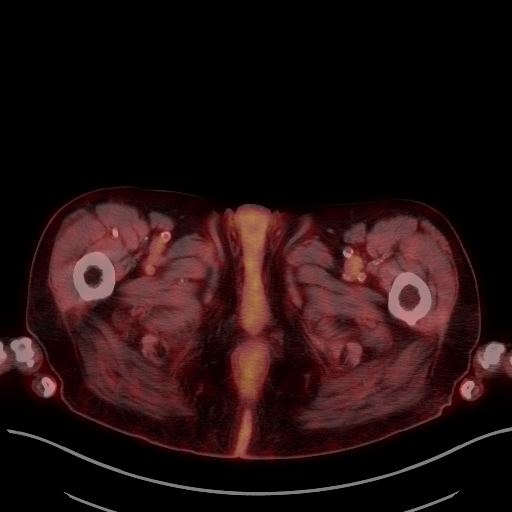

[Series 1032: results mm oncology reading · 0.74mm/px · 1 of 2 slices shown]
[im 1/2]
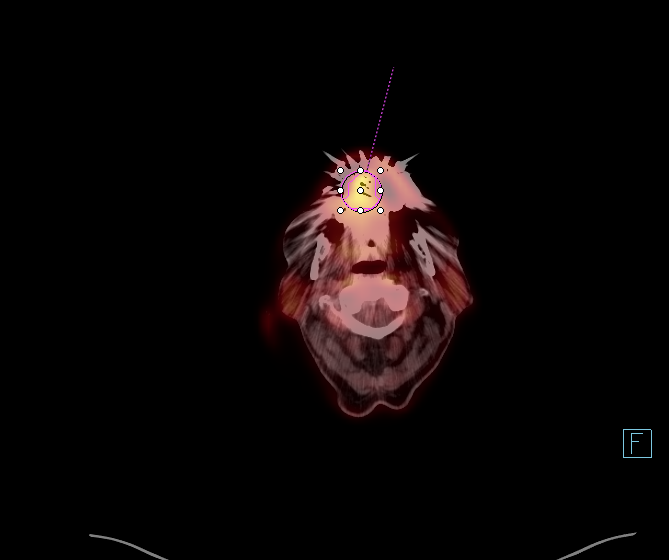

[25 of 25 positions shown; findings below may reference images not displayed]

FINDINGS: NECK

In the anterior medial floor of the mouth anus intense metabolic
activity with SUV max 7.5. There is a single submental lymph node on
the right measuring 11 mm (57, series 4 )with mild metabolic
activity (SUV max 1.8). The submandibular glands have normal
symmetric metabolic activity. No hypermetabolic cervical adenopathy.

CHEST

No hypermetabolic mediastinal hilar lymph nodes. There is a nodule
in the right lower lobe measuring 8 mm (image 38, series 8) without
associated metabolic activity. The thyroid gland is enlarged on the
left and right without metabolic activity likely presenting benign
goiterous enlargement.

ABDOMEN/PELVIS

No abnormal hypermetabolic activity within the liver, pancreas,
adrenal glands, or spleen. No hypermetabolic lymph nodes in the
abdomen or pelvis.

SKELETON

No focal hypermetabolic activity to suggest skeletal metastasis.
IMPRESSION: 1. Metabolic activity within the anterior floor of the mouth likely
represents primary neoplasm.
2. A single right submental lymph node with low metabolic activity
is indeterminate.
3. No evidence of cervical hypermetabolic nodes.
4. No evidence of distant metastasis.
5. Right lower lobe pulmonary nodule without associated metabolic
activity is likely infectious or inflammatory.
6. Benign enlargement of thyroid glands.
